# Patient Record
Sex: Female | Born: 1975 | Race: Black or African American | Hispanic: No | Marital: Married | Smoking: Never smoker
Health system: Southern US, Community
[De-identification: ages and names within clinical notes are randomized; demographics above are authoritative.]

## PROBLEM LIST (undated history)

## (undated) DIAGNOSIS — K219 Gastro-esophageal reflux disease without esophagitis: Secondary | ICD-10-CM

## (undated) DIAGNOSIS — Z8489 Family history of other specified conditions: Secondary | ICD-10-CM

## (undated) DIAGNOSIS — Z87898 Personal history of other specified conditions: Secondary | ICD-10-CM

## (undated) DIAGNOSIS — D649 Anemia, unspecified: Secondary | ICD-10-CM

## (undated) HISTORY — PX: ABLATION: SHX5711

---

## 2012-05-07 ENCOUNTER — Emergency Department: Payer: Self-pay | Admitting: Emergency Medicine

## 2012-05-07 LAB — LIPASE, BLOOD: Lipase: 85 U/L (ref 73–393)

## 2012-05-07 LAB — WET PREP, GENITAL

## 2012-05-07 LAB — COMPREHENSIVE METABOLIC PANEL
Alkaline Phosphatase: 78 U/L (ref 50–136)
Calcium, Total: 8.8 mg/dL (ref 8.5–10.1)
Chloride: 106 mmol/L (ref 98–107)
Co2: 24 mmol/L (ref 21–32)
Creatinine: 0.6 mg/dL (ref 0.60–1.30)
EGFR (African American): >60
Osmolality: 269 (ref 275–301)
Potassium: 4.3 mmol/L (ref 3.5–5.1)
SGOT(AST): 263 U/L — ABNORMAL HIGH (ref 15–37)
SGPT (ALT): 119 U/L — ABNORMAL HIGH (ref 12–78)

## 2012-05-07 LAB — URINALYSIS, COMPLETE
Blood: NEGATIVE
Ketone: NEGATIVE
Leukocyte Esterase: NEGATIVE
Nitrite: NEGATIVE
Ph: 7 (ref 4.5–8.0)
RBC,UR: 1 /HPF (ref 0–5)
Squamous Epithelial: 4

## 2012-05-07 LAB — CBC
MCH: 26.6 pg (ref 26.0–34.0)
MCHC: 31.7 g/dL — ABNORMAL LOW (ref 32.0–36.0)
MCV: 84 fL (ref 80–100)
Platelet: 219 10*3/uL (ref 150–440)

## 2012-05-07 LAB — PREGNANCY, URINE: Pregnancy Test, Urine: NEGATIVE m[IU]/mL

## 2015-08-30 ENCOUNTER — Encounter: Payer: Self-pay | Admitting: Sports Medicine

## 2015-08-30 ENCOUNTER — Ambulatory Visit (INDEPENDENT_AMBULATORY_CARE_PROVIDER_SITE_OTHER): Payer: Managed Care, Other (non HMO) | Admitting: Sports Medicine

## 2015-08-30 ENCOUNTER — Ambulatory Visit (INDEPENDENT_AMBULATORY_CARE_PROVIDER_SITE_OTHER): Payer: Managed Care, Other (non HMO)

## 2015-08-30 DIAGNOSIS — M21619 Bunion of unspecified foot: Secondary | ICD-10-CM | POA: Diagnosis not present

## 2015-08-30 DIAGNOSIS — M2141 Flat foot [pes planus] (acquired), right foot: Secondary | ICD-10-CM | POA: Diagnosis not present

## 2015-08-30 DIAGNOSIS — M79673 Pain in unspecified foot: Secondary | ICD-10-CM

## 2015-08-30 DIAGNOSIS — M722 Plantar fascial fibromatosis: Secondary | ICD-10-CM

## 2015-08-30 DIAGNOSIS — M2040 Other hammer toe(s) (acquired), unspecified foot: Secondary | ICD-10-CM

## 2015-08-30 DIAGNOSIS — M2142 Flat foot [pes planus] (acquired), left foot: Secondary | ICD-10-CM
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
## 2015-08-30 NOTE — Patient Instructions (Signed)
Flat Feet Having flat feet is a common condition. One foot or both might be affected. People of any age can have flat feet. In fact, everyone is born with them. But most of the time, the foot gradually develops an arch. That is the curve on the bottom of the foot that creates a gap between the foot and the ground. An arch usually develops in childhood. Sometimes, though, an arch never develops and the foot stays flat on the bottom. Other times, an arch develops but later collapses (caves in). That is what gives the condition its nickname, "fallen arches." The medical term for flat feet is pes planus. Some people have flat feet their whole life and have no problems. For others, the condition causes pain and needs to be corrected.  CAUSES   A problem with the foot's soft tissue; tendons and ligaments could be loose.  This can cause what is called flexible flat feet. That means the shape of the foot changes with pressure. When standing on the toes, a curved arch can be seen. When standing on the ground, the foot is flat.  Wear and tear. Sometimes arches simply flatten over time.  Damage to the posterior tibial tendon. This is the tendon that goes from the inside of the ankle to the bones in the middle of the foot. It is the main support for the arch. If the tendon is injured, stretched or torn, the arch might flatten.  Tarsal coalition. With this condition, two or more bones in the foot are joined together (fused ) during development in the womb. This limits movement and can lead to a flat foot. SYMPTOMS   The foot is even with the ground from toe to heel. Your caregiver will look closely at the inside of the foot while you are standing.  Pain along the bottom of the foot. Some people describe the pain as tightness.  Swelling on the inside of the foot or ankle.  Changes in the way you walk (gait).  The feet lean inward, starting at the ankle (pronation). DIAGNOSIS  To decide if a child or  adult has flat feet, a healthcare provider will probably:  Do a physical examination. This might include having the person stand on his or her toes and then stand normally. The caregiver will also hold the foot and put pressure on the foot in different directions.  Check the person's shoes. The pattern of wear on the soles can offer clues.  Order images (pictures) of the foot. They can help identify the cause of any pain. They also will show injuries to bones or tendons that could be causing the condition. The images can come from:  X-rays.  Computed tomography (CT) scan. This combines X-ray and a computer.  Magnetic resonance imaging (MRI). This uses magnets, radio waves and a computer to take a picture of the foot. It is the best technique to evaluate tendons, ligaments and muscles. TREATMENT   Flexible flat feet usually are painless. Most of the time, gait is not affected. Most children grow out of the condition. Often no treatment is needed. If there is pain, treatment options include:  Orthotics. These are inserts that go in the shoes. They add support and shape to the feet. An orthotic is custom-made from a mold of the foot.  Shoes. Not all shoes are the same. People with flat feet need arch support. However, too much can be painful. It is important to find shoes that offer the right amount   of support. Athletes, especially runners, may need to try shoes made just for people with flatter feet.  Medication. For pain, only take over-the-counter medicine for pain, discomfort, as directed by your caregiver.  Rest. If the feet start to hurt, cut back on the exercise which increases the pain. Use common sense.  For damage to the posterior tibial tendon, options include:  Orthotics. Also adding a wedge on the inside edge may help. This can relieve pressure on the tendon.  Ankle brace, boot or cast. These supports can ease the load on the tendon while it heals.  Surgery. If the tendon is  torn, it might need to be repaired.  For tarsal coalition, similar options apply:  Pain medication.  Orthotics.  A cast and crutches. This keeps weight off the foot.  Physical therapy.  Surgery to remove the bone bridge joining the two bones together. PROGNOSIS  In most people, flat feet do not cause pain or problems. People can go about their normal activities. However, if flat feet are painful, they can and should be treated. Treatment usually relieves the pain. HOME CARE INSTRUCTIONS   Take any medications prescribed by the healthcare provider. Follow the directions carefully.  Wear, or make sure a child wears, orthotics or special shoes if this was suggested. Be sure to ask how often and for how long they should be worn.  Do any exercises or therapy treatments that were suggested.  Take notes on when the pain occurs. This will help healthcare providers decide how to treat the condition.  If surgery is needed, be sure to find out if there is anything that should or should not be done before the operation. SEEK MEDICAL CARE IF:   Pain worsens in the foot or lower leg.  Pain disappears after treatment, but then returns.  Walking or simple exercise becomes difficult or causes foot pain.  Orthotics or special shoes are uncomfortable or painful.   This information is not intended to replace advice given to you by your health care provider. Make sure you discuss any questions you have with your health care provider.   Document Released: 06/03/2009 Document Revised: 10/29/2011 Document Reviewed: 02/02/2015 Elsevier Interactive Patient Education 2016 [employer]  

## 2015-08-30 NOTE — Progress Notes (Deleted)
   Subjective:    Patient ID: Amanda LamerLeslie Lawson, female    DOB: 02/20/1976, 40 y.o.   MRN: [id number]  HPI    Review of Systems  All other systems reviewed and are negative.      Objective:   Physical Exam        Assessment & Plan:

## 2015-10-11 ENCOUNTER — Ambulatory Visit: Payer: Managed Care, Other (non HMO) | Admitting: Sports Medicine

## 2016-03-27 ENCOUNTER — Ambulatory Visit: Payer: Managed Care, Other (non HMO) | Admitting: Sports Medicine

## 2016-12-03 ENCOUNTER — Other Ambulatory Visit: Payer: Self-pay | Admitting: Family Medicine

## 2016-12-03 DIAGNOSIS — Z1239 Encounter for other screening for malignant neoplasm of breast: Secondary | ICD-10-CM

## 2017-07-10 ENCOUNTER — Encounter: Payer: Self-pay | Admitting: Emergency Medicine

## 2017-07-10 ENCOUNTER — Other Ambulatory Visit: Payer: Self-pay

## 2017-07-10 ENCOUNTER — Ambulatory Visit
Admission: EM | Admit: 2017-07-10 | Discharge: 2017-07-10 | Disposition: A | Discharge status: Home / Self Care | Payer: Commercial Managed Care - PPO | Attending: Family Medicine | Admitting: Family Medicine

## 2017-07-10 DIAGNOSIS — N3001 Acute cystitis with hematuria: Secondary | ICD-10-CM

## 2017-07-10 DIAGNOSIS — N390 Urinary tract infection, site not specified: Secondary | ICD-10-CM

## 2017-07-10 DIAGNOSIS — R300 Dysuria: Secondary | ICD-10-CM | POA: Diagnosis not present

## 2017-07-10 LAB — URINALYSIS, COMPLETE (UACMP) WITH MICROSCOPIC
BILIRUBIN URINE: NEGATIVE
GLUCOSE, UA: NEGATIVE mg/dL
KETONES UR: NEGATIVE mg/dL
NITRITE: NEGATIVE
PH: 8 (ref 5.0–8.0)
Protein, ur: 100 mg/dL — AB
Specific Gravity, Urine: 1.02 (ref 1.005–1.030)

## 2017-07-10 MED ORDER — CEPHALEXIN 500 MG PO CAPS: 500.0000 mg | ORAL_CAPSULE | Freq: Two times a day (BID) | ORAL | 0 refills | Status: DC

## 2017-07-10 MED ORDER — PHENAZOPYRIDINE HCL 200 MG PO TABS: 200.0000 mg | ORAL_TABLET | Freq: Three times a day (TID) | ORAL | 0 refills | Status: DC

## 2017-07-10 NOTE — ED Provider Notes (Signed)
[facility]    CSN: [id number] Arrival date & time: 07/10/17  1712     History   Chief Complaint Chief Complaint  Patient presents with  . Dysuria    HPI Amanda Sparks is a 41 y.o. female.   HPI  A 41 year old female complains of burning , frequency, urgency and hematuria that she has had for 1 week.  Dates that she has had frequent urinary tract infections in the past.  She has been ruled out for kidney stones in the past as well.  She does not complain of any flank pain.  She has no nausea vomiting.  She has had no back pain.  Had suprapubic pubic pain evaluated by her primary care provider who did not find "anything wrong".  She states that she is having periods in fact last month she had 2 periods in the month which is unusual and her last period this month ended on the 18th.  It is possible that she is having another  Due to irregularity.         History reviewed. No pertinent past medical history.  There are no active problems to display for this patient.   Past Surgical History:  Procedure Laterality Date  . ABLATION      OB History    No data available       Home Medications    Prior to Admission medications   Medication Sig Start Date End Date Taking? Authorizing Provider  ferrous sulfate 325 (65 FE) MG EC tablet Take 1 tablet by mouth daily. 06/21/15  Yes [provider]  cephALEXin (KEFLEX) 500 MG capsule Take 1 capsule (500 mg total) by mouth 2 (two) times daily. 07/10/17   Lutricia Feiloemer, [place] P, [name]  phenazopyridine (PYRIDIUM) 200 MG tablet Take 1 tablet (200 mg total) by mouth 3 (three) times daily. 07/10/17   Lutricia Feiloemer, Haden Cavenaugh P, [name]    Family History Family History  Problem Relation Age of Onset  . Hypertension Mother   . Diabetes Father     Social History Social History   Tobacco Use  . Smoking status: Never Smoker  . Smokeless tobacco: Never Used  Substance Use Topics  . Alcohol use: No    Alcohol/week: 0.0 oz   Frequency: Never  . Drug use: No     Allergies   Bactrim [sulfamethoxazole-trimethoprim]   Review of Systems Review of Systems  Constitutional: Positive for activity change. Negative for chills, fatigue and fever.  Genitourinary: Positive for dysuria, frequency, hematuria and urgency. Negative for difficulty urinating, dyspareunia, flank pain and vaginal discharge.  All other systems reviewed and are negative.    Physical Exam Triage Vital Signs ED Triage Vitals  Enc Vitals Group     BP 07/10/17 1729 120/70     Pulse Rate 07/10/17 1729 84     Resp 07/10/17 1729 14     Temp 07/10/17 1729 99.1 F (37.3 C)     Temp Source 07/10/17 1729 Oral     SpO2 07/10/17 1729 100 %     Weight 07/10/17 1726 160 lb (72.6 kg)     Height 07/10/17 1726 5\' 4"  (1.626 m)     Head Circumference --      Peak Flow --      Pain Score 07/10/17 1726 9     Pain Loc --      Pain Edu? --      Excl. in GC? --    No data found.  Updated Vital  Signs BP 120/70 (BP Location: Left Arm)   Pulse 84   Temp 99.1 F (37.3 C) (Oral)   Resp 14   Ht 5\' 4"  (1.626 m)   Wt 160 lb (72.6 kg)   LMP 07/07/2017 (Approximate)   SpO2 100%   BMI 27.46 kg/m   Visual Acuity Right Eye Distance:   Left Eye Distance:   Bilateral Distance:    Right Eye Near:   Left Eye Near:    Bilateral Near:     Physical Exam  Constitutional: She is oriented to person, place, and time. She appears well-developed and well-nourished. No distress.  HENT:  Head: Normocephalic.  Eyes: Pupils are equal, round, and reactive to light.  Neck: Normal range of motion.  Pulmonary/Chest: Effort normal and breath sounds normal.  Abdominal: Soft. Bowel sounds are normal. She exhibits no distension and no mass. There is no tenderness. There is no rebound and no guarding.  No CVA tenderness  Musculoskeletal: Normal range of motion.  Neurological: She is alert and oriented to person, place, and time.  Skin: Skin is warm and dry. She is not  diaphoretic.  Psychiatric: She has a normal mood and affect. Her behavior is normal. Judgment and thought content normal.  Nursing note and vitals reviewed.    UC Treatments / Results  Labs (all labs ordered are listed, but only abnormal results are displayed) Labs Reviewed  URINALYSIS, COMPLETE (UACMP) WITH MICROSCOPIC - Abnormal; Notable for the following components:      Result Value   Color, Urine RED (*)    APPearance HAZY (*)    Hgb urine dipstick LARGE (*)    Protein, ur 100 (*)    Leukocytes, UA MODERATE (*)    Squamous Epithelial / LPF 0-5 (*)    Bacteria, UA FEW (*)    All other components within normal limits  URINE CULTURE    EKG  EKG Interpretation None       Radiology No results found.  Procedures Procedures (including critical care time)  Medications Ordered in UC Medications - No data to display   Initial Impression / Assessment and Plan / UC Course  I have reviewed the triage vital signs and the nursing notes.  Pertinent labs & imaging results that were available during my care of the patient were reviewed by me and considered in my medical decision making (see chart for details).     Plan: 1. Test/x-ray results and diagnosis reviewed with patient 2. rx as per orders; risks, benefits, potential side effects reviewed with patient 3. Recommend supportive treatment with increased hydration.  We will treat as if this is a UTI but hematuria may be from irregular periods as she had one last month.  I asked her that if the hematuria does not subside she should go to her primary care physician next week for further evaluation.  Urine cultures will be available in 48 hours.  We will start her on Keflex for 5 days and Pyridium for 2 days.  Begins to run fevers have flank pain nausea or vomiting she should go immediately to the emergency room. 4. F/u prn if symptoms worsen or don't improve   Final Clinical Impressions(s) / UC Diagnoses   Final diagnoses:    Lower urinary tract infectious disease  Dysuria  Hematuria due to acute cystitis    ED Discharge Orders        Ordered    phenazopyridine (PYRIDIUM) 200 MG tablet  3 times daily  07/10/17 1752    cephALEXin (KEFLEX) 500 MG capsule  2 times daily     07/10/17 1752       Controlled Substance Prescriptions [place] Controlled Substance Registry consulted? Not Applicable   Lutricia FeilRoemer, Tannis Burstein P, [name] 07/10/17 [id number]

## 2017-07-10 NOTE — ED Triage Notes (Signed)
Patient c/o burning when she urinates, blood in her urine and increase in urinary frequency for a week.

## 2017-07-13 LAB — URINE CULTURE: Culture: 40000 — AB

## 2017-07-30 ENCOUNTER — Telehealth: Payer: Self-pay | Admitting: *Deleted

## 2017-07-30 NOTE — Telephone Encounter (Signed)
[facility] for [facility] results. Verified DOB, communicated positive for bacteria urine culture result. Advised patient to complete keflex prescribed during visit. Patient reported feeling much better.

## 2017-11-05 ENCOUNTER — Other Ambulatory Visit: Payer: Self-pay | Admitting: Family Medicine

## 2017-11-05 ENCOUNTER — Ambulatory Visit
Admission: RE | Admit: 2017-11-05 | Discharge: 2017-11-05 | Disposition: A | Discharge status: Home / Self Care | Payer: Commercial Managed Care - PPO | Source: Ambulatory Visit | Attending: Family Medicine | Admitting: Family Medicine

## 2017-11-05 ENCOUNTER — Encounter (INDEPENDENT_AMBULATORY_CARE_PROVIDER_SITE_OTHER): Payer: Self-pay

## 2017-11-05 DIAGNOSIS — Z1239 Encounter for other screening for malignant neoplasm of breast: Secondary | ICD-10-CM

## 2017-11-05 DIAGNOSIS — Z1231 Encounter for screening mammogram for malignant neoplasm of breast: Secondary | ICD-10-CM | POA: Insufficient documentation

## 2017-11-25 ENCOUNTER — Ambulatory Visit
Admission: EM | Admit: 2017-11-25 | Discharge: 2017-11-25 | Disposition: A | Discharge status: Home / Self Care | Payer: Commercial Managed Care - PPO | Attending: Family Medicine | Admitting: Family Medicine

## 2017-11-25 ENCOUNTER — Other Ambulatory Visit: Payer: Self-pay

## 2017-11-25 DIAGNOSIS — R319 Hematuria, unspecified: Secondary | ICD-10-CM

## 2017-11-25 DIAGNOSIS — R300 Dysuria: Secondary | ICD-10-CM | POA: Diagnosis not present

## 2017-11-25 DIAGNOSIS — N3001 Acute cystitis with hematuria: Secondary | ICD-10-CM | POA: Diagnosis not present

## 2017-11-25 DIAGNOSIS — R350 Frequency of micturition: Secondary | ICD-10-CM

## 2017-11-25 LAB — URINALYSIS, COMPLETE (UACMP) WITH MICROSCOPIC

## 2017-11-25 MED ORDER — CEPHALEXIN 500 MG PO CAPS: 500.0000 mg | ORAL_CAPSULE | Freq: Two times a day (BID) | ORAL | 0 refills | Status: DC

## 2017-11-25 NOTE — ED Triage Notes (Signed)
Patient complains of urinary urgency, frequency, burning with urination, hematuria, right lower abdominal pain that radiates through right flank.

## 2017-11-25 NOTE — ED Provider Notes (Signed)
[facility]    CSN: [id number] Arrival date & time: 11/25/17  0809     History   Chief Complaint Chief Complaint  Patient presents with  . Urinary Frequency    HPI Amanda Sparks is a 42 y.o. female.   The history is provided by the patient.  Urinary Frequency  This is a new problem. Associated symptoms include abdominal pain (mild).  Dysuria  Pain quality:  Burning Pain severity:  Mild Onset quality:  Sudden Duration:  7 days Timing:  Constant Progression:  Worsening Chronicity:  New Recent urinary tract infections: no   Relieved by:  Phenazopyridine Urinary symptoms: frequent urination and hematuria   Associated symptoms: abdominal pain (mild)   Associated symptoms: no fever, no flank pain, no nausea, no vaginal discharge and no vomiting   Risk factors: no hx of pyelonephritis, no hx of urolithiasis, no kidney transplant, not pregnant, no recurrent urinary tract infections, no renal cysts, no renal disease, no single kidney and no urinary catheter     History reviewed. No pertinent past medical history.  There are no active problems to display for this patient.   Past Surgical History:  Procedure Laterality Date  . ABLATION      OB History   None      Home Medications    Prior to Admission medications   Medication Sig Start Date End Date Taking? Authorizing Provider  ferrous sulfate 325 (65 FE) MG EC tablet Take 1 tablet by mouth daily. 06/21/15  Yes [provider]  cephALEXin (KEFLEX) 500 MG capsule Take 1 capsule (500 mg total) by mouth 2 (two) times daily. 11/25/17   Payton Mccallumonty, Ardenia Stiner, MD  phenazopyridine (PYRIDIUM) 200 MG tablet Take 1 tablet (200 mg total) by mouth 3 (three) times daily. 07/10/17   Lutricia Feiloemer, William P, [name]    Family History Family History  Problem Relation Age of Onset  . Hypertension Mother   . Diabetes Father   . Breast cancer Maternal Grandmother     Social History Social History   Tobacco Use  .  Smoking status: Never Smoker  . Smokeless tobacco: Never Used  Substance Use Topics  . Alcohol use: No    Alcohol/week: 0.0 oz    Frequency: Never  . Drug use: No     Allergies   Bactrim [sulfamethoxazole-trimethoprim]   Review of Systems Review of Systems  Constitutional: Negative for fever.  Gastrointestinal: Positive for abdominal pain (mild). Negative for nausea and vomiting.  Genitourinary: Positive for dysuria and frequency. Negative for flank pain and vaginal discharge.     Physical Exam Triage Vital Signs ED Triage Vitals  Enc Vitals Group     BP 11/25/17 0826 (!) 121/50     Pulse Rate 11/25/17 0826 82     Resp 11/25/17 0826 16     Temp 11/25/17 0826 98.6 F (37 C)     Temp Source 11/25/17 0826 Oral     SpO2 11/25/17 0826 100 %     Weight 11/25/17 0825 159 lb (72.1 kg)     Height 11/25/17 0825 5\' 4"  (1.626 m)     Head Circumference --      Peak Flow --      Pain Score 11/25/17 0825 2     Pain Loc --      Pain Edu? --      Excl. in GC? --    No data found.  Updated Vital Signs BP (!) 121/50 (BP Location: Left Arm)  Pulse 82   Temp 98.6 F (37 C) (Oral)   Resp 16   Ht 5\' 4"  (1.626 m)   Wt 159 lb (72.1 kg)   LMP 11/07/2017   SpO2 100%   BMI 27.29 kg/m   Visual Acuity Right Eye Distance:   Left Eye Distance:   Bilateral Distance:    Right Eye Near:   Left Eye Near:    Bilateral Near:     Physical Exam  Constitutional: She appears well-developed and well-nourished. No distress.  Abdominal: Soft. Bowel sounds are normal. She exhibits no distension and no mass. There is tenderness (mild; suprapubic). There is no rebound and no guarding.  Skin: She is not diaphoretic.  Nursing note and vitals reviewed.    UC Treatments / Results  Labs (all labs ordered are listed, but only abnormal results are displayed) Labs Reviewed  URINALYSIS, COMPLETE (UACMP) WITH MICROSCOPIC - Abnormal; Notable for the following components:      Result Value    Color, Urine ORANGE (*)    APPearance TURBID (*)    Glucose, UA   (*)    Value: TEST NOT REPORTED DUE TO COLOR INTERFERENCE OF URINE PIGMENT   Hgb urine dipstick   (*)    Value: TEST NOT REPORTED DUE TO COLOR INTERFERENCE OF URINE PIGMENT   Bilirubin Urine   (*)    Value: TEST NOT REPORTED DUE TO COLOR INTERFERENCE OF URINE PIGMENT   Ketones, ur   (*)    Value: TEST NOT REPORTED DUE TO COLOR INTERFERENCE OF URINE PIGMENT   Protein, ur   (*)    Value: TEST NOT REPORTED DUE TO COLOR INTERFERENCE OF URINE PIGMENT   Nitrite   (*)    Value: TEST NOT REPORTED DUE TO COLOR INTERFERENCE OF URINE PIGMENT   Leukocytes, UA   (*)    Value: TEST NOT REPORTED DUE TO COLOR INTERFERENCE OF URINE PIGMENT   Squamous Epithelial / LPF 0-5 (*)    Bacteria, UA RARE (*)    All other components within normal limits  URINE CULTURE    EKG None Radiology No results found.  Procedures Procedures (including critical care time)  Medications Ordered in UC Medications - No data to display   Initial Impression / Assessment and Plan / UC Course  I have reviewed the triage vital signs and the nursing notes.  Pertinent labs & imaging results that were available during my care of the patient were reviewed by me and considered in my medical decision making (see chart for details).       Final Clinical Impressions(s) / UC Diagnoses   Final diagnoses:  Acute cystitis with hematuria    ED Discharge Orders        Ordered    cephALEXin (KEFLEX) 500 MG capsule  2 times daily     11/25/17 0908     1. Lab results and diagnosis reviewed with patient 2. rx as per orders above; reviewed possible side effects, interactions, risks and benefits  3. Recommend supportive treatment with increased water intake 4. Follow-up prn if symptoms worsen or don't improve  Controlled Substance Prescriptions [place] Controlled Substance Registry consulted? Not Applicable   Payton Mccallum, MD 11/25/17 1031

## 2017-11-27 ENCOUNTER — Telehealth (HOSPITAL_COMMUNITY): Payer: Self-pay

## 2017-11-27 LAB — URINE CULTURE
Culture: 50000 — AB
SPECIAL REQUESTS: NORMAL

## 2017-11-27 NOTE — Telephone Encounter (Signed)
Pt contacted regarding test results from recent visit. [facility] and was given Keflex at urgent care visit. Pt educated on completing antibiotic and to follow up if symptoms are persistent.

## 2017-12-22 ENCOUNTER — Ambulatory Visit
Admission: EM | Admit: 2017-12-22 | Discharge: 2017-12-22 | Disposition: A | Discharge status: Home / Self Care | Payer: Commercial Managed Care - PPO | Attending: Family Medicine | Admitting: Family Medicine

## 2017-12-22 DIAGNOSIS — B9689 Other specified bacterial agents as the cause of diseases classified elsewhere: Secondary | ICD-10-CM

## 2017-12-22 DIAGNOSIS — M545 Low back pain: Secondary | ICD-10-CM | POA: Diagnosis not present

## 2017-12-22 DIAGNOSIS — N760 Acute vaginitis: Secondary | ICD-10-CM

## 2017-12-22 DIAGNOSIS — R300 Dysuria: Secondary | ICD-10-CM | POA: Diagnosis not present

## 2017-12-22 DIAGNOSIS — B373 Candidiasis of vulva and vagina: Secondary | ICD-10-CM

## 2017-12-22 DIAGNOSIS — B3731 Acute candidiasis of vulva and vagina: Secondary | ICD-10-CM

## 2017-12-22 LAB — WET PREP, GENITAL
Sperm: NONE SEEN
TRICH WET PREP: NONE SEEN

## 2017-12-22 LAB — URINALYSIS, COMPLETE (UACMP) WITH MICROSCOPIC
Glucose, UA: NEGATIVE mg/dL
Nitrite: NEGATIVE
PROTEIN: 30 mg/dL — AB
Specific Gravity, Urine: 1.025 (ref 1.005–1.030)
pH: 6 (ref 5.0–8.0)

## 2017-12-22 MED ORDER — FLUCONAZOLE 150 MG PO TABS: 150.0000 mg | ORAL_TABLET | Freq: Every day | ORAL | 0 refills | Status: DC

## 2017-12-22 MED ORDER — METRONIDAZOLE 500 MG PO TABS: 500.0000 mg | ORAL_TABLET | Freq: Two times a day (BID) | ORAL | 0 refills | Status: DC

## 2017-12-22 MED ORDER — NITROFURANTOIN MONOHYD MACRO 100 MG PO CAPS: 100.0000 mg | ORAL_CAPSULE | Freq: Two times a day (BID) | ORAL | 0 refills | Status: DC

## 2017-12-22 NOTE — Discharge Instructions (Addendum)
Take medication as prescribed. Rest. Drink plenty of fluids.  ° °Follow up with your primary care physician this week. Return to Urgent care for new or worsening concerns.  ° °

## 2017-12-22 NOTE — ED Provider Notes (Signed)
[facility] ____________________________________________  Time seen: Approximately 9:38 AM  I have reviewed the triage vital signs and the nursing notes.   HISTORY  Chief Complaint Vaginitis   HPI Amanda Sparks is a 42 y.o. female presented for evaluation of concern of vaginal symptoms.  Patient reports approximate 1 month ago she was seen and treated for urinary tract infection with oral Keflex.  States as she was completing the antibiotic urinary symptoms resolved, but she started to notice some vaginal itching and occasional vaginal discharge.  States initially it was a white clumpy thick discharge consistent with previous yeast infections.  States that she has tried over-the-counter Monistat type yeast medication 3 times without resolution.  States over the last few days she has had continued intermittent discharge whitish to what yellowish as well as some itching and irritation.  Denies any vaginal pain.  States occasional suprapubic pressure and occasional frequency, denies other urinary symptoms.  Does have some left back pain, but reports pain is only present with movement and fully reproducible.  Denies trauma.  No accompanying fevers.  Abdomen otherwise feels well.  Reports otherwise feels well and denies other complaints.  Sexually active with one partner,  declines concerns of STDs.  Denies pregnancy.  History reviewed. No pertinent past medical history.  There are no active problems to display for this patient.   Past Surgical History:  Procedure Laterality Date  . ABLATION       No current facility-administered medications for this encounter.   Current Outpatient Medications:  .  ferrous sulfate 325 (65 FE) MG EC tablet, Take 1 tablet by mouth daily., Disp: , Rfl: 0 .  fluconazole (DIFLUCAN) 150 MG tablet, Take 1 tablet (150 mg total) by mouth daily. Take one pill orally, then Repeat in one week as needed., Disp: 2 tablet, Rfl: 0 .  metroNIDAZOLE (FLAGYL)  500 MG tablet, Take 1 tablet (500 mg total) by mouth 2 (two) times daily., Disp: 14 tablet, Rfl: 0 .  nitrofurantoin, macrocrystal-monohydrate, (MACROBID) 100 MG capsule, Take 1 capsule (100 mg total) by mouth 2 (two) times daily., Disp: 10 capsule, Rfl: 0 .  phenazopyridine (PYRIDIUM) 200 MG tablet, Take 1 tablet (200 mg total) by mouth 3 (three) times daily., Disp: 6 tablet, Rfl: 0  Allergies Bactrim [sulfamethoxazole-trimethoprim]  Family History  Problem Relation Age of Onset  . Hypertension Mother   . Diabetes Father   . Breast cancer Maternal Grandmother     Social History Social History   Tobacco Use  . Smoking status: Never Smoker  . Smokeless tobacco: Never Used  Substance Use Topics  . Alcohol use: No    Alcohol/week: 0.0 oz    Frequency: Never  . Drug use: No    Review of Systems Constitutional: No fever/chills Cardiovascular: Denies chest pain. Respiratory: Denies shortness of breath. Gastrointestinal: No abdominal pain.  No nausea, no vomiting.  No diarrhea.   Genitourinary: as above Musculoskeletal: as above. Skin: Negative for rash.   ____________________________________________   PHYSICAL EXAM:  VITAL SIGNS: ED Triage Vitals  Enc Vitals Group     BP 12/22/17 0854 (!) 110/41     Pulse Rate 12/22/17 0854 75     Resp 12/22/17 0854 18     Temp 12/22/17 0854 98.2 F (36.8 C)     Temp Source 12/22/17 0854 Oral     SpO2 12/22/17 0854 100 %     Weight --      Height --      Head  Circumference --      Peak Flow --      Pain Score 12/22/17 0857 9     Pain Loc --      Pain Edu? --      Excl. in GC? --     Constitutional: Alert and oriented. Well appearing and in no acute distress. Cardiovascular: Normal rate, regular rhythm. Grossly normal heart sounds.  Good peripheral circulation. Respiratory: Normal respiratory effort without tachypnea nor retractions. Breath sounds are clear and equal bilaterally. No wheezes, rales, rhonchi. Gastrointestinal:  Soft and nontender.  No CVA tenderness. Musculoskeletal:  No midline cervical, thoracic or lumbar tenderness to palpation.  Mild left lower back pain along the lower latissimus dorsi, fully reproducible by direct palpation as well as rotation, no bony tenderness. Neurologic:  Normal speech and language.  Speech is normal. No gait instability.  Skin:  Skin is warm, dry and intact. No rash noted. Psychiatric: Mood and affect are normal. Speech and behavior are normal. Patient exhibits appropriate insight and judgment   ___________________________________________   LABS (all labs ordered are listed, but only abnormal results are displayed)  Labs Reviewed  WET PREP, GENITAL - Abnormal; Notable for the following components:      Result Value   Yeast Wet Prep HPF POC PRESENT (*)    Clue Cells Wet Prep HPF POC PRESENT (*)    WBC, Wet Prep HPF POC FEW (*)    All other components within normal limits  URINALYSIS, COMPLETE (UACMP) WITH MICROSCOPIC - Abnormal; Notable for the following components:   APPearance CLOUDY (*)    Hgb urine dipstick TRACE (*)    Bilirubin Urine SMALL (*)    Ketones, ur TRACE (*)    Protein, ur 30 (*)    Leukocytes, UA SMALL (*)    Bacteria, UA RARE (*)    All other components within normal limits  URINE CULTURE     PROCEDURES Procedures    INITIAL IMPRESSION / ASSESSMENT AND PLAN / ED COURSE  Pertinent labs & imaging results that were available during my care of the patient were reviewed by me and considered in my medical decision making (see chart for details).  Well-appearing patient.  No acute distress.  Will evaluate wet prep as well as urinalysis.  Left back pain suspect musculoskeletal.  Patient denied pelvic exam and elected self wet prep.  Results reviewed.  Positive for bacterial vaginosis and yeast vaginitis.  Urinalysis also reviewed and concern for UTI.  Will treat patient with oral Macrobid, Flagyl and Diflucan.  Discussed probiotics  over-the-counter and supportive care.  Follow-up with primary care in 1 week for urinary resolution.  Encourage rest, fluids, supportive care.Discussed indication, risks and benefits of medications with patient, including no alcohol with medication.  Discussed follow up with Primary care physician this week. Discussed follow up and return parameters including no resolution or any worsening concerns. Patient verbalized understanding and agreed to plan.   ____________________________________________   FINAL CLINICAL IMPRESSION(S) / ED DIAGNOSES  Final diagnoses:  Dysuria  Yeast vaginitis  Bacterial vaginitis     ED Discharge Orders        Ordered    nitrofurantoin, macrocrystal-monohydrate, (MACROBID) 100 MG capsule  2 times daily     12/22/17 1005    metroNIDAZOLE (FLAGYL) 500 MG tablet  2 times daily     12/22/17 1005    fluconazole (DIFLUCAN) 150 MG tablet  Daily     12/22/17 1005       Note:  This dictation was prepared with Dragon dictation along with smaller phrase technology. Any transcriptional errors that result from this process are unintentional.         Renford Dills, NP 12/22/17 1101

## 2017-12-22 NOTE — ED Triage Notes (Signed)
Pt said she thinks the antibiotics caused a yeast infection. She was trying otc products but states she has odor, clear discharge with yellow tint but urinary problems has resolved.

## 2017-12-25 LAB — URINE CULTURE: Culture: >=100000 — AB

## 2017-12-27 ENCOUNTER — Telehealth (HOSPITAL_COMMUNITY): Payer: Self-pay

## 2017-12-27 NOTE — Telephone Encounter (Signed)
Urine culture positive for E.Coli, this was treated with Macrobid at [facility] visit. Attempted to reach patient to go over results, no answer at this time.

## 2018-06-04 ENCOUNTER — Ambulatory Visit: Payer: Self-pay | Admitting: Psychiatry

## 2018-06-04 DIAGNOSIS — F902 Attention-deficit hyperactivity disorder, combined type: Secondary | ICD-10-CM | POA: Insufficient documentation

## 2018-06-04 DIAGNOSIS — F88 Other disorders of psychological development: Secondary | ICD-10-CM | POA: Insufficient documentation

## 2018-06-04 DIAGNOSIS — F331 Major depressive disorder, recurrent, moderate: Secondary | ICD-10-CM | POA: Insufficient documentation

## 2018-06-04 DIAGNOSIS — F4312 Post-traumatic stress disorder, chronic: Secondary | ICD-10-CM | POA: Insufficient documentation

## 2018-06-04 DIAGNOSIS — F429 Obsessive-compulsive disorder, unspecified: Secondary | ICD-10-CM | POA: Insufficient documentation

## 2018-06-05 ENCOUNTER — Other Ambulatory Visit: Payer: Self-pay | Admitting: Psychiatry

## 2018-06-05 NOTE — Progress Notes (Unsigned)
Scheduling entry error for patient of same name and age at [facility] psychiatric removing pre-charting of the incorrect appointment.

## 2018-08-28 ENCOUNTER — Encounter: Payer: Self-pay | Admitting: Emergency Medicine

## 2018-08-28 ENCOUNTER — Emergency Department: Payer: Commercial Managed Care - PPO

## 2018-08-28 ENCOUNTER — Emergency Department
Admission: EM | Admit: 2018-08-28 | Discharge: 2018-08-28 | Disposition: A | Discharge status: Home / Self Care | Payer: Commercial Managed Care - PPO | Attending: Emergency Medicine | Admitting: Emergency Medicine

## 2018-08-28 DIAGNOSIS — R51 Headache: Secondary | ICD-10-CM | POA: Insufficient documentation

## 2018-08-28 DIAGNOSIS — S199XXA Unspecified injury of neck, initial encounter: Secondary | ICD-10-CM | POA: Diagnosis present

## 2018-08-28 DIAGNOSIS — S161XXA Strain of muscle, fascia and tendon at neck level, initial encounter: Secondary | ICD-10-CM | POA: Diagnosis not present

## 2018-08-28 DIAGNOSIS — Y92410 Unspecified street and highway as the place of occurrence of the external cause: Secondary | ICD-10-CM | POA: Diagnosis not present

## 2018-08-28 DIAGNOSIS — Y9389 Activity, other specified: Secondary | ICD-10-CM | POA: Diagnosis not present

## 2018-08-28 DIAGNOSIS — M25511 Pain in right shoulder: Secondary | ICD-10-CM | POA: Insufficient documentation

## 2018-08-28 DIAGNOSIS — Y998 Other external cause status: Secondary | ICD-10-CM | POA: Diagnosis not present

## 2018-08-28 LAB — URINALYSIS, COMPLETE (UACMP) WITH MICROSCOPIC
BILIRUBIN URINE: NEGATIVE
Bacteria, UA: NONE SEEN
Glucose, UA: NEGATIVE mg/dL
Hgb urine dipstick: NEGATIVE
Ketones, ur: NEGATIVE mg/dL
Leukocytes, UA: NEGATIVE
Nitrite: NEGATIVE
Protein, ur: NEGATIVE mg/dL
SPECIFIC GRAVITY, URINE: 1.015 (ref 1.005–1.030)
pH: 7 (ref 5.0–8.0)

## 2018-08-28 LAB — POCT PREGNANCY, URINE: PREG TEST UR: NEGATIVE

## 2018-08-28 MED ORDER — TRAMADOL HCL 50 MG PO TABS: 50.0000 mg | ORAL_TABLET | Freq: Four times a day (QID) | ORAL | 0 refills | Status: DC | PRN

## 2018-08-28 MED ORDER — ACETAMINOPHEN 500 MG PO TABS
1000.0000 mg | ORAL_TABLET | Freq: Once | ORAL | Status: AC
  Administered 2018-08-28: 1000 mg via ORAL
  Filled 2018-08-28: qty 2

## 2018-08-28 MED ORDER — OXYCODONE HCL 5 MG PO TABS
5.0000 mg | ORAL_TABLET | Freq: Once | ORAL | Status: AC
  Administered 2018-08-28: 5 mg via ORAL
  Filled 2018-08-28: qty 1
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
## 2018-09-08 ENCOUNTER — Other Ambulatory Visit: Payer: Self-pay | Admitting: Orthopedic Surgery

## 2018-09-16 ENCOUNTER — Encounter
Admission: RE | Admit: 2018-09-16 | Discharge: 2018-09-16 | Disposition: A | Discharge status: Home / Self Care | Payer: Commercial Managed Care - PPO | Source: Ambulatory Visit | Attending: Orthopedic Surgery | Admitting: Orthopedic Surgery

## 2018-09-16 ENCOUNTER — Other Ambulatory Visit: Payer: Self-pay

## 2018-09-16 ENCOUNTER — Encounter: Payer: Self-pay | Admitting: *Deleted

## 2018-09-16 HISTORY — DX: Anemia, unspecified: D64.9

## 2018-09-16 HISTORY — DX: Personal history of other specified conditions: Z87.898

## 2018-09-16 HISTORY — DX: Family history of other specified conditions: Z84.89

## 2018-09-16 HISTORY — DX: Gastro-esophageal reflux disease without esophagitis: K21.9

## 2018-09-16 NOTE — Patient Instructions (Signed)
Your procedure is scheduled on: 09-23-18 TUESDAY Report to Same Day Surgery 2nd floor medical mall [facility] Entrance-take elevator on left to 2nd floor.  Check in with surgery information desk.) To find out your arrival time please call [phone] between 1PM - 3PM on 09-22-18 MONDAY  Remember: Instructions that are not followed completely may result in serious medical risk, up to and including death, or upon the discretion of your surgeon and anesthesiologist your surgery may need to be rescheduled.    _x___ 1. Do not eat food after midnight the night before your procedure. NO GUM OR CANDY AFTER MIDNIGHT.  You may drink clear liquids up to 2 hours before you are scheduled to arrive at the hospital for your procedure.  Do not drink clear liquids within 2 hours of your scheduled arrival to the hospital.  Clear liquids include  --Water or Apple juice without pulp  --Clear carbohydrate beverage such as ClearFast or Gatorade  --Black Coffee or Clear Tea (No milk, no creamers, do not add anything to the coffee or Tea   ____Ensure clear carbohydrate drink on the way to the hospital for bariatric patients  ____Ensure clear carbohydrate drink 3 hours before surgery for Dr Rutherford Nail patients if physician instructed.    __x__ 2. No Alcohol for 24 hours before or after surgery.   __x__3. No Smoking or e-cigarettes for 24 prior to surgery.  Do not use any chewable tobacco products for at least 6 hour prior to surgery   ____  4. Bring all medications with you on the day of surgery if instructed.    __x__ 5. Notify your doctor if there is any change in your medical condition     (cold, fever, infections).    x___6. On the morning of surgery brush your teeth with toothpaste and water.  You may rinse your mouth with mouth wash if you wish.  Do not swallow any toothpaste or mouthwash.   Do not wear jewelry, make-up, hairpins, clips or nail polish.  Do not wear lotions, powders, or perfumes. You  may wear deodorant.  Do not shave 48 hours prior to surgery. Men may shave face and neck.  Do not bring valuables to the hospital.    [facility] is not responsible for any belongings or valuables.               Contacts, dentures or bridgework may not be worn into surgery.  Leave your suitcase in the car. After surgery it may be brought to your room.  For patients admitted to the hospital, discharge time is determined by your treatment team.  _  Patients discharged the day of surgery will not be allowed to drive home.  You will need someone to drive you home and stay with you the night of your procedure.    Please read over the following fact sheets that you were given:   [facility] Preparing for Surgery  ____ Take anti-hypertensive listed below, cardiac, seizure, asthma, anti-reflux and psychiatric medicines. These include:  1. NONE  2.  3.  4.  5.  6.  ____Fleets enema or Magnesium Citrate as directed.   _x___ Use CHG Soap or sage wipes as directed on instruction sheet   ____ Use inhalers on the day of surgery and bring to hospital day of surgery  ____ Stop Metformin and Janumet 2 days prior to surgery.    ____ Take 1/2 of usual insulin dose the night before surgery and none on the  morning surgery.   ____ Follow recommendations from Cardiologist, Pulmonologist or PCP regarding stopping Aspirin, Coumadin, Plavix ,Eliquis, Effient, or Pradaxa, and Pletal.  X____Stop Anti-inflammatories such as Advil, Aleve, Ibuprofen, Motrin, Naproxen, Naprosyn, Goodies powders or aspirin products NOW-OK to take Tylenol    ____ Stop supplements until after surgery.   ____ Bring C-Pap to the hospital.
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
## 2018-09-17 ENCOUNTER — Inpatient Hospital Stay: Admission: RE | Admit: 2018-09-17 | Payer: Commercial Managed Care - PPO | Source: Ambulatory Visit

## 2018-09-18 ENCOUNTER — Encounter
Admission: RE | Admit: 2018-09-18 | Discharge: 2018-09-18 | Disposition: A | Discharge status: Home / Self Care | Payer: Commercial Managed Care - PPO | Source: Ambulatory Visit | Attending: Orthopedic Surgery | Admitting: Orthopedic Surgery

## 2018-09-18 DIAGNOSIS — Z01812 Encounter for preprocedural laboratory examination: Secondary | ICD-10-CM | POA: Insufficient documentation

## 2018-09-18 LAB — CBC WITH DIFFERENTIAL/PLATELET
ABS IMMATURE GRANULOCYTES: 0.01 10*3/uL (ref 0.00–0.07)
Basophils Absolute: 0 10*3/uL (ref 0.0–0.1)
Basophils Relative: 1 %
Eosinophils Absolute: 0 10*3/uL (ref 0.0–0.5)
Eosinophils Relative: 1 %
HCT: 39.9 % (ref 36.0–46.0)
Hemoglobin: 12.5 g/dL (ref 12.0–15.0)
Immature Granulocytes: 0 %
Lymphocytes Relative: 37 %
Lymphs Abs: 1.3 10*3/uL (ref 0.7–4.0)
MCH: 26.5 pg (ref 26.0–34.0)
MCHC: 31.3 g/dL (ref 30.0–36.0)
MCV: 84.7 fL (ref 80.0–100.0)
Monocytes Absolute: 0.3 10*3/uL (ref 0.1–1.0)
Monocytes Relative: 8 %
Neutro Abs: 1.8 10*3/uL (ref 1.7–7.7)
Neutrophils Relative %: 53 %
Platelets: 333 10*3/uL (ref 150–400)
RBC: 4.71 MIL/uL (ref 3.87–5.11)
RDW: 12.9 % (ref 11.5–15.5)
WBC: 3.4 10*3/uL — ABNORMAL LOW (ref 4.0–10.5)
nRBC: 0 % (ref 0.0–0.2)

## 2018-09-18 LAB — APTT: APTT: 35 s (ref 24–36)

## 2018-09-18 LAB — BASIC METABOLIC PANEL
Anion gap: 5 (ref 5–15)
BUN: 9 mg/dL (ref 6–20)
CO2: 26 mmol/L (ref 22–32)
Calcium: 8.9 mg/dL (ref 8.9–10.3)
Chloride: 107 mmol/L (ref 98–111)
Creatinine, Ser: 0.6 mg/dL (ref 0.44–1.00)
GFR calc Af Amer: >60 mL/min (ref >60)
GFR calc non Af Amer: >60 mL/min (ref >60)
Glucose, Bld: 80 mg/dL (ref 70–99)
POTASSIUM: 3.4 mmol/L — AB (ref 3.5–5.1)
Sodium: 138 mmol/L (ref 135–145)

## 2018-09-18 LAB — PROTIME-INR
INR: 0.94
Prothrombin Time: 12.5 seconds (ref 11.4–15.2)

## 2018-09-29 MED ORDER — CEFAZOLIN SODIUM-DEXTROSE 2-4 GM/100ML-% IV SOLN
2.0000 g | INTRAVENOUS | Status: AC
  Administered 2018-09-30: 2 g via INTRAVENOUS

## 2018-09-30 ENCOUNTER — Ambulatory Visit
Admission: RE | Admit: 2018-09-30 | Discharge: 2018-09-30 | Disposition: A | Discharge status: Home / Self Care | Payer: Commercial Managed Care - PPO | Source: Ambulatory Visit | Attending: Orthopedic Surgery | Admitting: Orthopedic Surgery

## 2018-09-30 ENCOUNTER — Ambulatory Visit: Payer: Commercial Managed Care - PPO | Admitting: Certified Registered Nurse Anesthetist

## 2018-09-30 ENCOUNTER — Other Ambulatory Visit: Payer: Self-pay

## 2018-09-30 ENCOUNTER — Encounter
Admission: RE | Disposition: A | Discharge status: Home / Self Care | Payer: Self-pay | Source: Ambulatory Visit | Attending: Orthopedic Surgery

## 2018-09-30 ENCOUNTER — Encounter: Payer: Self-pay | Admitting: *Deleted

## 2018-09-30 DIAGNOSIS — G5601 Carpal tunnel syndrome, right upper limb: Secondary | ICD-10-CM | POA: Insufficient documentation

## 2018-09-30 DIAGNOSIS — Z881 Allergy status to other antibiotic agents status: Secondary | ICD-10-CM | POA: Diagnosis not present

## 2018-09-30 DIAGNOSIS — Z79899 Other long term (current) drug therapy: Secondary | ICD-10-CM | POA: Insufficient documentation

## 2018-09-30 HISTORY — PX: CARPAL TUNNEL RELEASE: SHX101

## 2018-09-30 LAB — POCT PREGNANCY, URINE: Preg Test, Ur: NEGATIVE

## 2018-09-30 SURGERY — CARPAL TUNNEL RELEASE
Anesthesia: General | Site: Hand | Laterality: Right

## 2018-09-30 MED ORDER — CHLORHEXIDINE GLUCONATE CLOTH 2 % EX PADS: 6.0000 | MEDICATED_PAD | Freq: Once | CUTANEOUS | Status: DC

## 2018-09-30 MED ORDER — PROPOFOL 10 MG/ML IV BOLUS
INTRAVENOUS | Status: DC | PRN
  Administered 2018-09-30: 160 mg via INTRAVENOUS
  Administered 2018-09-30: 40 mg via INTRAVENOUS

## 2018-09-30 MED ORDER — GENTAMICIN SULFATE 40 MG/ML IJ SOLN
INTRAMUSCULAR | Status: AC
  Filled 2018-09-30: qty 2

## 2018-09-30 MED ORDER — MIDAZOLAM HCL 2 MG/2ML IJ SOLN
INTRAMUSCULAR | Status: AC
  Filled 2018-09-30: qty 2

## 2018-09-30 MED ORDER — LIDOCAINE HCL (PF) 2 % IJ SOLN
INTRAMUSCULAR | Status: AC
  Filled 2018-09-30: qty 10

## 2018-09-30 MED ORDER — BUPIVACAINE HCL (PF) 0.25 % IJ SOLN
INTRAMUSCULAR | Status: AC
  Filled 2018-09-30: qty 30

## 2018-09-30 MED ORDER — FENTANYL CITRATE (PF) 100 MCG/2ML IJ SOLN
INTRAMUSCULAR | Status: AC
  Filled 2018-09-30: qty 2

## 2018-09-30 MED ORDER — PROPOFOL 10 MG/ML IV BOLUS
INTRAVENOUS | Status: AC
  Filled 2018-09-30: qty 20

## 2018-09-30 MED ORDER — LACTATED RINGERS IV SOLN
INTRAVENOUS | Status: DC
  Administered 2018-09-30: 07:00:00 via INTRAVENOUS

## 2018-09-30 MED ORDER — GLYCOPYRROLATE 0.2 MG/ML IJ SOLN
INTRAMUSCULAR | Status: AC
  Filled 2018-09-30: qty 1

## 2018-09-30 MED ORDER — SODIUM CHLORIDE 0.9 % IV SOLN
INTRAVENOUS | Status: DC | PRN
  Administered 2018-09-30: 08:00:00

## 2018-09-30 MED ORDER — FENTANYL CITRATE (PF) 100 MCG/2ML IJ SOLN
25.0000 ug | INTRAMUSCULAR | Status: DC | PRN
  Administered 2018-09-30 (×4): 25 ug via INTRAVENOUS

## 2018-09-30 MED ORDER — DEXAMETHASONE SODIUM PHOSPHATE 10 MG/ML IJ SOLN
INTRAMUSCULAR | Status: AC
  Filled 2018-09-30: qty 1

## 2018-09-30 MED ORDER — MIDAZOLAM HCL 2 MG/2ML IJ SOLN
INTRAMUSCULAR | Status: DC | PRN
  Administered 2018-09-30: 2 mg via INTRAVENOUS

## 2018-09-30 MED ORDER — OXYCODONE HCL 5 MG PO TABS: 5.0000 mg | ORAL_TABLET | ORAL | 0 refills | Status: DC | PRN

## 2018-09-30 MED ORDER — FAMOTIDINE 20 MG PO TABS
20.0000 mg | ORAL_TABLET | Freq: Once | ORAL | Status: AC
  Administered 2018-09-30: 20 mg via ORAL

## 2018-09-30 MED ORDER — FENTANYL CITRATE (PF) 100 MCG/2ML IJ SOLN
INTRAMUSCULAR | Status: DC | PRN
  Administered 2018-09-30: 25 ug via INTRAVENOUS
  Administered 2018-09-30: 50 ug via INTRAVENOUS
  Administered 2018-09-30: 25 ug via INTRAVENOUS

## 2018-09-30 MED ORDER — ONDANSETRON HCL 4 MG/2ML IJ SOLN
4.0000 mg | Freq: Once | INTRAMUSCULAR | Status: AC | PRN
  Administered 2018-09-30: 4 mg via INTRAVENOUS

## 2018-09-30 MED ORDER — ONDANSETRON HCL 4 MG/2ML IJ SOLN
INTRAMUSCULAR | Status: DC | PRN
  Administered 2018-09-30: 4 mg via INTRAVENOUS

## 2018-09-30 MED ORDER — SUCCINYLCHOLINE CHLORIDE 20 MG/ML IJ SOLN
INTRAMUSCULAR | Status: AC
  Filled 2018-09-30: qty 1

## 2018-09-30 MED ORDER — FENTANYL CITRATE (PF) 100 MCG/2ML IJ SOLN
INTRAMUSCULAR | Status: AC
  Administered 2018-09-30: 25 ug via INTRAVENOUS
  Filled 2018-09-30: qty 2

## 2018-09-30 MED ORDER — LIDOCAINE HCL (CARDIAC) PF 100 MG/5ML IV SOSY
PREFILLED_SYRINGE | INTRAVENOUS | Status: DC | PRN
  Administered 2018-09-30: 60 mg via INTRAVENOUS

## 2018-09-30 MED ORDER — PHENYLEPHRINE HCL 10 MG/ML IJ SOLN
INTRAMUSCULAR | Status: AC
  Filled 2018-09-30: qty 1

## 2018-09-30 MED ORDER — ONDANSETRON HCL 4 MG/2ML IJ SOLN
INTRAMUSCULAR | Status: AC
  Filled 2018-09-30: qty 2

## 2018-09-30 MED ORDER — EPHEDRINE SULFATE 50 MG/ML IJ SOLN
INTRAMUSCULAR | Status: AC
  Filled 2018-09-30: qty 1

## 2018-09-30 MED ORDER — DEXAMETHASONE SODIUM PHOSPHATE 10 MG/ML IJ SOLN
INTRAMUSCULAR | Status: DC | PRN
  Administered 2018-09-30: 10 mg via INTRAVENOUS

## 2018-09-30 MED ORDER — ONDANSETRON HCL 4 MG PO TABS: 4.0000 mg | ORAL_TABLET | Freq: Three times a day (TID) | ORAL | 0 refills | Status: DC | PRN

## 2018-09-30 SURGICAL SUPPLY — 46 items
BANDAGE ELASTIC 4 LF NS (GAUZE/BANDAGES/DRESSINGS) ×6 IMPLANT
BLADE SURG MINI STRL (BLADE) ×3 IMPLANT
BNDG ESMARK 4X12 TAN STRL LF (GAUZE/BANDAGES/DRESSINGS) ×3 IMPLANT
CANISTER SUCT 1200ML W/VALVE (MISCELLANEOUS) ×3 IMPLANT
CLOSURE WOUND 1/2 X4 (GAUZE/BANDAGES/DRESSINGS) ×1
CORD BIP STRL DISP 12FT (MISCELLANEOUS) ×3 IMPLANT
COVER WAND RF STERILE (DRAPES) IMPLANT
CUFF TOURN 18 STER (MISCELLANEOUS) ×3 IMPLANT
DRAPE IMP U-DRAPE 54X76 (DRAPES) ×3 IMPLANT
DRAPE SURG 17X11 SM STRL (DRAPES) ×3 IMPLANT
DRSG GAUZE FLUFF 36X18 (GAUZE/BANDAGES/DRESSINGS) ×3 IMPLANT
DURAPREP 26ML APPLICATOR (WOUND CARE) ×6 IMPLANT
ELECT REM PT RETURN 9FT ADLT (ELECTROSURGICAL) ×3
ELECTRODE REM PT RTRN 9FT ADLT (ELECTROSURGICAL) ×1 IMPLANT
FORCEPS JEWEL BIP 4-3/4 STR (INSTRUMENTS) ×3 IMPLANT
GAUZE PETRO XEROFOAM 1X8 (MISCELLANEOUS) ×3 IMPLANT
GAUZE SPONGE 4X4 12PLY STRL (GAUZE/BANDAGES/DRESSINGS) ×3 IMPLANT
GLOVE BIOGEL PI IND STRL 7.5 (GLOVE) ×6 IMPLANT
GLOVE BIOGEL PI IND STRL 9 (GLOVE) ×1 IMPLANT
GLOVE BIOGEL PI INDICATOR 7.5 (GLOVE) ×12
GLOVE BIOGEL PI INDICATOR 9 (GLOVE) ×2
GLOVE SURG 9.0 ORTHO LTXF (GLOVE) ×6 IMPLANT
GOWN STRL REUS TWL 2XL XL LVL4 (GOWN DISPOSABLE) ×3 IMPLANT
GOWN STRL REUS W/ TWL LRG LVL3 (GOWN DISPOSABLE) ×3 IMPLANT
GOWN STRL REUS W/TWL LRG LVL3 (GOWN DISPOSABLE) ×6
KIT TURNOVER KIT A (KITS) ×3 IMPLANT
NEEDLE FILTER BLUNT 18X 1/2SAF (NEEDLE) ×2
NEEDLE FILTER BLUNT 18X1 1/2 (NEEDLE) ×1 IMPLANT
NS IRRIG 500ML POUR BTL (IV SOLUTION) ×3 IMPLANT
PACK EXTREMITY ARMC (MISCELLANEOUS) ×3 IMPLANT
PAD CAST CTTN 4X4 STRL (SOFTGOODS) ×2 IMPLANT
PADDING CAST 4IN STRL (MISCELLANEOUS) ×2
PADDING CAST BLEND 4X4 STRL (MISCELLANEOUS) ×1 IMPLANT
PADDING CAST COTTON 4X4 STRL (SOFTGOODS) ×4
SLING ARM LRG DEEP (SOFTGOODS) IMPLANT
SLING ARM M TX990204 (SOFTGOODS) ×3 IMPLANT
SPLINT CAST 1 STEP 3X12 (MISCELLANEOUS) ×3 IMPLANT
STOCKINETTE STRL 4IN 9604848 (GAUZE/BANDAGES/DRESSINGS) ×3 IMPLANT
STRIP CLOSURE SKIN 1/2X4 (GAUZE/BANDAGES/DRESSINGS) ×2 IMPLANT
SUT ETHILON 3-0 FS-10 30 BLK (SUTURE) ×3
SUT ETHILON 4-0 (SUTURE)
SUT ETHILON 4-0 FS2 18XMFL BLK (SUTURE)
SUT ETHILON 5-0 FS-2 18 BLK (SUTURE) ×3 IMPLANT
SUTURE EHLN 3-0 FS-10 30 BLK (SUTURE) ×1 IMPLANT
SUTURE ETHLN 4-0 FS2 18XMF BLK (SUTURE) IMPLANT
SYR 3ML LL SCALE MARK (SYRINGE) ×3 IMPLANT

## 2018-09-30 NOTE — Anesthesia Procedure Notes (Signed)
Procedure Name: LMA Insertion Date/Time: 09/30/2018 7:59 AM Performed by: Riccardo Dubin, CRNA Pre-anesthesia Checklist: Patient identified, Patient being monitored, Timeout performed, Emergency Drugs available and Suction available Patient Re-evaluated:Patient Re-evaluated prior to induction Oxygen Delivery Method: Circle system utilized Preoxygenation: Pre-oxygenation with 100% oxygen Induction Type: IV induction Ventilation: Mask ventilation without difficulty LMA: LMA inserted LMA Size: 3.5 Tube type: Oral Number of attempts: 1 Placement Confirmation: positive ETCO2 and breath sounds checked- equal and bilateral Tube secured with: Tape Dental Injury: Teeth and Oropharynx as per pre-operative assessment

## 2018-09-30 NOTE — Transfer of Care (Signed)
Immediate Anesthesia Transfer of Care Note  Patient: Amanda Sparks  Procedure(s) Performed: CARPAL TUNNEL RELEASE (Right Hand)  Patient Location: PACU  Anesthesia Type:General  Level of Consciousness: drowsy  Airway & Oxygen Therapy: Patient Spontanous Breathing and Patient connected to face mask oxygen  Post-op Assessment: Report given to RN and Post -op Vital signs reviewed and stable  Post vital signs: Reviewed and stable  Last Vitals:  Vitals Value Taken Time  BP 138/50 09/30/2018  9:06 AM  Temp 36.6 C 09/30/2018  9:06 AM  Pulse 71 09/30/2018  9:11 AM  Resp 11 09/30/2018  9:11 AM  SpO2 100 % 09/30/2018  9:11 AM  Vitals shown include unvalidated device data.  Last Pain:  Vitals:   09/30/18 0906  PainSc: Asleep         Complications: No apparent anesthesia complications

## 2018-09-30 NOTE — H&P (Signed)
PREOPERATIVE H&P  Chief Complaint: CARPAL TUNNEL SYNDROME OF RIGHT WRIST  HPI: Amanda Sparks is a 43 y.o. female who presents for preoperative history and physical with a diagnosis of CARPAL TUNNEL SYNDROME OF RIGHT WRIST confirmed by EMG/NCV. Symptoms are rated as moderate to severe, and have been worsening.  This is significantly impairing activities of daily living.  She has elected for surgical management.   Past Medical History:  Diagnosis Date  . Anemia   . Family history of adverse reaction to anesthesia    1st cousin-never woke back up after surgery  . GERD (gastroesophageal reflux disease)    h/o  . History of palpitations    Past Surgical History:  Procedure Laterality Date  . ABLATION     Social History   Socioeconomic History  . Marital status: Married    Spouse name: Not on file  . Number of children: Not on file  . Years of education: Not on file  . Highest education level: Not on file  Occupational History  . Not on file  Social Needs  . Financial resource strain: Not on file  . Food insecurity:    Worry: Not on file    Inability: Not on file  . Transportation needs:    Medical: Not on file    Non-medical: Not on file  Tobacco Use  . Smoking status: Never Smoker  . Smokeless tobacco: Never Used  Substance and Sexual Activity  . Alcohol use: No    Alcohol/week: 0.0 standard drinks    Frequency: Never  . Drug use: No  . Sexual activity: Yes  Lifestyle  . Physical activity:    Days per week: Not on file    Minutes per session: Not on file  . Stress: Not on file  Relationships  . Social connections:    Talks on phone: Not on file    Gets together: Not on file    Attends religious service: Not on file    Active member of club or organization: Not on file    Attends meetings of clubs or organizations: Not on file    Relationship status: Not on file  Other Topics Concern  . Not on file  Social History Narrative  . Not on file   Family  History  Problem Relation Age of Onset  . Hypertension Mother   . Diabetes Father   . Breast cancer Maternal Grandmother    Allergies  Allergen Reactions  . Bactrim [Sulfamethoxazole-Trimethoprim] Rash   Prior to Admission medications   Medication Sig Start Date End Date Taking? Authorizing Provider  acetaminophen (TYLENOL) 325 MG tablet Take 650 mg by mouth every 6 (six) hours as needed.   Yes [provider]  Cholecalciferol (VITAMIN D3) 1.25 MG (50000 UT) CAPS Take 1 tablet by mouth once a week. X 4 weeks   Yes [provider]  Cholecalciferol (VITAMIN D3) 75 MCG (3000 UT) TABS Take 3,000 Units by mouth daily.   Yes [provider]  traMADol (ULTRAM) 50 MG tablet Take 1 tablet (50 mg total) by mouth every 6 (six) hours as needed. Patient not taking: Reported on 09/16/2018 08/28/18 08/28/19 Yes Don PerkingVeronese, [place], MD     Positive ROS: All other systems have been reviewed and were otherwise negative with the exception of those mentioned in the HPI and as above.  Physical Exam: General: Alert, no acute distress Cardiovascular: Regular rate and rhythm, no murmurs rubs or gallops.  No pedal edema Respiratory: Clear to auscultation  bilaterally, no wheezes rales or rhonchi. No cyanosis, no use of accessory musculature GI: No organomegaly, abdomen is soft and non-tender nondistended with positive bowel sounds. Skin: Skin intact, no lesions within the operative field. Neurologic: Sensation intact distally Psychiatric: Patient is competent for consent with normal mood and affect Lymphatic: No cervical lymphadenopathy  MUSCULOSKELETAL: Right wrist:  + paresthesias/decreased sensation in thumb, index and middle fingers.  No thenar or hypothenar atrophy.  Full digital ROM.  Fingers well perfused.  Assessment: CARPAL TUNNEL SYNDROME OF RIGHT WRIST  Plan: Plan for Procedure(s): CARPAL TUNNEL RELEASE, RIGHT WRIST  I have discussed the details of the operation and the  post-op course with the patient.  I discussed the risks and benefits of surgery. The risks include but are not limited to infection, bleeding, nerve or blood vessel injury, joint stiffness or loss of motion, persistent pain or numbness, recurrent carpal tunnel syndrome and the need for further surgery. Medical risks include but are not limited to DVT and pulmonary embolism, myocardial infarction, stroke, pneumonia, respiratory failure and death. Patient understood these risks and wished to proceed.     Juanell Fairly, MD   09/30/2018 7:50 AM

## 2018-09-30 NOTE — Op Note (Signed)
  09/30/2018  9:10 AM  PATIENT:  Amanda Sparks    PRE-OPERATIVE DIAGNOSIS:  RIGHT CARPAL TUNNEL SYNDROME   POST-OPERATIVE DIAGNOSIS:  Same  PROCEDURE:  RIGHT OPEN CARPAL TUNNEL RELEASE  SURGEON:  Thornton Park, MD  ANESTHESIA:   General  TOURNIQUET TIME:  32 minutes  PREOPERATIVE INDICATIONS:  Amanda Sparks is a  43 y.o. female with a diagnosis of RIGHT CARPAL TUNNEL SYNDROME who failed conservative measures and elected for surgical management.    I discussed the risks and benefits of surgery. The risks include but are not limited to infection, bleeding, nerve or blood vessel injury, joint stiffness or loss of motion, persistent pain, weakness, recurrence of symptoms and the need for further surgery. Medical risks include but are not limited to DVT and pulmonary embolism, myocardial infarction, stroke, pneumonia, respiratory failure and death. Patient understood these risks and wished to proceed.   OPERATIVE FINDINGS: Significant median nerve compression at the carpal tunnel, right upper extremity  OPERATIVE PROCEDURE: Patient was met in the preoperative area. I signed the right wrist with my initials and the word yes according the hospital's correct site of surgery protocol. The H&P was updated. I answered all the patient's questions. She was then brought to the operating room where she underwent general with LMA.  She was positioned supine on the operative table. The right arm was placed on a hand table. A tourniquet was applied to the right upper extremity. The patient was prepped and draped in a sterile fashion. A timeout was performed to verify the patient's name, date of birth, medical record number, correct site of surgery correct procedure to be performed. The time out was also used to confirm the patient received antibiotics that all necessary instruments were available in the room. The right upper extremity was then exsanguinated with an Esmarch and the tourniquet  inflated to 250 mmHg.   An incision following the palmar crease was made. This was made in line with the web space between the middle and ring fingers and the distal extent of the incision was where it intersected Kaplan's cardinal line. Bleeding vessels were cauterized with a bipolar.  The subcutaneous tissue was carefully dissected out with a Metzenbaum scissor and pickup until the palmar fascia was encountered. The distal extent of the transverse carpal ligament was then identified. A Freer elevator was placed under the transverse carpal ligament running distally to proximally. A micro-Beaver blade was then used to incise the transverse carpal ligament taking care to avoid injury to any neurovascular structures. The carpal tunnel was found to be extremely constricted. There was significant compression on the median nerve. The transverse carpal ligament was completely released. The nerve was visualized in its entirety and the carpal tunnel.   The wound was copiously irrigated. The skin was then approximated with 5-0 nylon. Xeroform was placed over the incision. A dry sterile dressing was applied along with a volar fiberglass splint. Patient was overwrapped with an Ace wrap. The tourniquet was deflated at 32 minutes.  Sling was placed on the right upper extremity. The patient was extubated and brought to the PACU in stable condition. I was scrubbed and present the entire case and all sharp and instrument counts were correct at the conclusion the case.     Timoteo Gaul, MD

## 2018-09-30 NOTE — Anesthesia Preprocedure Evaluation (Signed)
Anesthesia Evaluation  Patient identified by MRN, date of birth, ID band Patient awake    Reviewed: Allergy & Precautions, H&P , NPO status , Patient's Chart, lab work & pertinent test results, reviewed documented beta blocker date and time   Airway Mallampati: II  TM Distance: >3 FB Neck ROM: full    Dental  (+) Teeth Intact   Pulmonary neg pulmonary ROS,    Pulmonary exam normal        Cardiovascular Exercise Tolerance: Good negative cardio ROS Normal cardiovascular exam Rate:Normal     Neuro/Psych negative neurological ROS  negative psych ROS   GI/Hepatic Neg liver ROS, GERD  Medicated,  Endo/Other  negative endocrine ROS  Renal/GU negative Renal ROS  negative genitourinary   Musculoskeletal   Abdominal   Peds  Hematology  (+) Blood dyscrasia, anemia ,   Anesthesia Other Findings   Reproductive/Obstetrics negative OB ROS                             Anesthesia Physical Anesthesia Plan  ASA: II  Anesthesia Plan: General LMA   Post-op Pain Management:    Induction:   PONV Risk Score and Plan:   Airway Management Planned:   Additional Equipment:   Intra-op Plan:   Post-operative Plan:   Informed Consent: I have reviewed the patients History and Physical, chart, labs and discussed the procedure including the risks, benefits and alternatives for the proposed anesthesia with the patient or authorized representative who has indicated his/her understanding and acceptance.       Plan Discussed with: CRNA  Anesthesia Plan Comments:         Anesthesia Quick Evaluation

## 2018-09-30 NOTE — Discharge Instructions (Signed)

## 2018-09-30 NOTE — Progress Notes (Signed)
Ch visited w/ pt and family member pre-op. Pt had a positive affect while ch conversed w/ her. Pt shared that shewas supposed to hv surgery at an earlier time but got a virus. Ch encouraged pt and ensured that the procedure would go well. Pt was thankful of visit.     09/30/18 0700  Clinical Encounter Type  Visited With Patient and family together  Visit Type Psychological support;Spiritual support;Social support;Pre-op  Spiritual Encounters  Spiritual Needs Emotional;Grief support  Stress Factors  Patient Stress Factors None identified  Family Stress Factors None identified

## 2018-09-30 NOTE — Anesthesia Post-op Follow-up Note (Signed)
Anesthesia QCDR form completed.        

## 2018-10-02 NOTE — Anesthesia Postprocedure Evaluation (Signed)
Anesthesia Post Note  Patient: Amanda Sparks  Procedure(s) Performed: CARPAL TUNNEL RELEASE (Right Hand)  Patient location during evaluation: PACU Anesthesia Type: General Level of consciousness: awake and alert Pain management: pain level controlled Vital Signs Assessment: post-procedure vital signs reviewed and stable Respiratory status: spontaneous breathing, nonlabored ventilation, respiratory function stable and patient connected to nasal cannula oxygen Cardiovascular status: blood pressure returned to baseline and stable Postop Assessment: no apparent nausea or vomiting Anesthetic complications: no     Last Vitals:  Vitals:   09/30/18 0940 09/30/18 0957  BP:  134/67  Pulse: 64 67  Resp: 14 16  Temp:  36.5 C  SpO2: 98% 100%    Last Pain:  Vitals:   09/30/18 0957  TempSrc: Temporal  PainSc: 2                  Yevette Edwards

## 2019-02-12 ENCOUNTER — Other Ambulatory Visit: Payer: Self-pay | Admitting: Family Medicine

## 2019-02-12 DIAGNOSIS — Z1231 Encounter for screening mammogram for malignant neoplasm of breast: Secondary | ICD-10-CM

## 2019-04-01 ENCOUNTER — Other Ambulatory Visit: Payer: Self-pay

## 2019-04-01 DIAGNOSIS — Z20822 Contact with and (suspected) exposure to covid-19: Secondary | ICD-10-CM

## 2019-04-02 LAB — NOVEL CORONAVIRUS, NAA: SARS-CoV-2, NAA: NOT DETECTED

## 2019-06-12 ENCOUNTER — Other Ambulatory Visit: Payer: Self-pay

## 2019-06-12 DIAGNOSIS — Z20822 Contact with and (suspected) exposure to covid-19: Secondary | ICD-10-CM

## 2019-06-14 LAB — NOVEL CORONAVIRUS, NAA: SARS-CoV-2, NAA: NOT DETECTED

## 2019-07-31 ENCOUNTER — Other Ambulatory Visit: Payer: Self-pay

## 2019-07-31 DIAGNOSIS — Z20822 Contact with and (suspected) exposure to covid-19: Secondary | ICD-10-CM

## 2019-08-02 LAB — NOVEL CORONAVIRUS, NAA: SARS-CoV-2, NAA: NOT DETECTED

## 2019-08-31 ENCOUNTER — Ambulatory Visit
Admission: RE | Admit: 2019-08-31 | Discharge: 2019-08-31 | Disposition: A | Discharge status: Home / Self Care | Payer: Commercial Managed Care - PPO | Source: Ambulatory Visit | Attending: Family Medicine | Admitting: Family Medicine

## 2019-08-31 ENCOUNTER — Other Ambulatory Visit: Payer: Self-pay

## 2019-08-31 DIAGNOSIS — Z1231 Encounter for screening mammogram for malignant neoplasm of breast: Secondary | ICD-10-CM | POA: Diagnosis present

## 2019-10-25 ENCOUNTER — Emergency Department
Admission: EM | Admit: 2019-10-25 | Discharge: 2019-10-25 | Disposition: A | Discharge status: Home / Self Care | Payer: Commercial Managed Care - PPO | Attending: Emergency Medicine | Admitting: Emergency Medicine

## 2019-10-25 ENCOUNTER — Other Ambulatory Visit: Payer: Self-pay

## 2019-10-25 DIAGNOSIS — Z79899 Other long term (current) drug therapy: Secondary | ICD-10-CM | POA: Insufficient documentation

## 2019-10-25 DIAGNOSIS — F4321 Adjustment disorder with depressed mood: Secondary | ICD-10-CM

## 2019-10-25 DIAGNOSIS — F410 Panic disorder [episodic paroxysmal anxiety] without agoraphobia: Secondary | ICD-10-CM | POA: Diagnosis present

## 2019-10-25 MED ORDER — ONDANSETRON 4 MG PO TBDP: 4.0000 mg | ORAL_TABLET | Freq: Three times a day (TID) | ORAL | 0 refills | Status: DC | PRN

## 2019-10-25 MED ORDER — ONDANSETRON 4 MG PO TBDP
4.0000 mg | ORAL_TABLET | Freq: Once | ORAL | Status: AC
  Administered 2019-10-25: 4 mg via ORAL
  Filled 2019-10-25: qty 1

## 2019-10-25 NOTE — Discharge Instructions (Signed)
You have been seen in the Emergency Department (ED)  today for a psychiatric complaint.  You have been evaluated by psychiatry and we believe you are safe to be discharged from the hospital.   ° °Please return to the Emergency Department (ED)  immediately if you have ANY thoughts of hurting yourself or anyone else, so that we may help you. ° °Please avoid alcohol and drug use. ° °Follow up with your doctor and/or therapist as soon as possible regarding today's ED  visit.  ° °You may call crisis hotline for [place] at [phone]. ° °
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
## 2019-10-25 NOTE — ED Triage Notes (Addendum)
Pt arrives via [facility] from home anxiety today, death in family, anxiety attack, hx of same ambulatory at house, nausea but no vomiting 137/63 92 HR normal sinus 97 RA 98.73f  Pt reports nausea with vomiting. Pt states she has a hx of anxiety attacks about once/mo and takes prescription medications for it.  Pt states she took the medication "that starts with a D" about an hour ago (around 1130 pm).

## 2020-08-21 ENCOUNTER — Telehealth: Payer: Commercial Managed Care - PPO | Admitting: Family

## 2020-08-21 ENCOUNTER — Encounter (INDEPENDENT_AMBULATORY_CARE_PROVIDER_SITE_OTHER): Payer: Self-pay

## 2020-08-21 DIAGNOSIS — R059 Cough, unspecified: Secondary | ICD-10-CM

## 2020-08-21 MED ORDER — BENZONATATE 100 MG PO CAPS: 100.0000 mg | ORAL_CAPSULE | Freq: Three times a day (TID) | ORAL | 0 refills | Status: DC | PRN

## 2020-08-21 MED ORDER — FLUTICASONE PROPIONATE 50 MCG/ACT NA SUSP: 2.0000 | Freq: Every day | NASAL | 6 refills | Status: DC

## 2020-08-21 NOTE — Progress Notes (Signed)
E-Visit for Corona Virus Screening  Your current symptoms could be consistent with the coronavirus.  Many health care providers can now test patients at their office but not all are.  [place] has multiple testing sites. For information on our COVID testing locations and hours go to [email or web address]  We are enrolling you in our MyChart Home Monitoring for COVID19 . Daily you will receive a questionnaire within the MyChart website. Our COVID 19 response team will be monitoring your responses daily.  Testing Information: The COVID-19 Community Testing sites are testing BY APPOINTMENT ONLY.  You can schedule online at [email or web address]  If you do not have access to a smart phone or computer you may call [phone] for an appointment.   Additional testing sites in the Community:  . For [facility] Testing sites in [place]  https://www.cvs.com/minuteclinic/covid-19-testing  . For Pop-up testing sites in [place]  https://covid19.ncdhhs.gov/about-covid-19/testing/find-my-testing-place/pop-testing-sites  . For [facility] https://[place].gov[place]ounty/human-services/health-department/coronavirus-covid-19-info/covid-19-testing  . For [place] testing in [place] https://[place].gov/[place]-county/human-services/health-department/coronavirus-covid-19-info/covid-19-testing  . For [facility] testing in [place]   https://[facility].care/covidtesting  For  more information about community testing call [phone]   Please quarantine yourself while awaiting your test results. Please stay home for a minimum of 10 days from the first day of illness with improving symptoms and you have had 24 hours of no fever (without the use of Tylenol (Acetaminophen) Motrin (Ibuprofen) or any fever reducing medication).  Also - Do not get tested prior to returning to work because once you have had a positive test the test can stay  positive for more than a month in some cases.   You should wear a mask or cloth face covering over your nose and mouth if you must be around other people or animals, including pets (even at home). Try to stay at least 6 feet away from other people. This will protect the people around you.  Please continue good preventive care measures, including:  frequent hand-washing, avoid touching your face, cover coughs/sneezes, stay out of crowds and keep a 6 foot distance from others.  COVID-19 is a respiratory illness with symptoms that are similar to the flu. Symptoms are typically mild to moderate, but there have been cases of severe illness and death due to the virus.   The following symptoms may appear 2-14 days after exposure: . Fever . Cough . Shortness of breath or difficulty breathing . Chills . Repeated shaking with chills . Muscle pain . Headache . Sore throat . New loss of taste or smell . Fatigue . Congestion or runny nose . Nausea or vomiting . Diarrhea  Go to the nearest hospital ED for assessment if fever/cough/breathlessness are severe or illness seems like a threat to life.  It is vitally important that if you feel that you have an infection such as this virus or any other virus that you stay home and away from places where you may spread it to others.  You should avoid contact with people age 65 and older.   You can use medication such as A prescription cough medication called Tessalon Perles 100 mg. You may take 1-2 capsules every 8 hours as needed for cough and A prescription for Fluticasone nasal spray 2 sprays in each nostril one time per day  You may also take acetaminophen (Tylenol) as needed for fever.  Reduce your risk of any infection by using the same precautions used for avoiding the common cold or flu:  . Wash your hands often with   soap and warm water for at least 20 seconds.  If soap and water are not readily available, use an alcohol-based hand sanitizer with at least  60% alcohol.  . If coughing or sneezing, cover your mouth and nose by coughing or sneezing into the elbow areas of your shirt or coat, into a tissue or into your sleeve (not your hands). . Avoid shaking hands with others and consider head nods or verbal greetings only. . Avoid touching your eyes, nose, or mouth with unwashed hands.  . Avoid close contact with people who are sick. . Avoid places or events with large numbers of people in one location, like concerts or sporting events. . Carefully consider travel plans you have or are making. . If you are planning any travel outside or inside the [place], visit the CDC's Travelers' Health webpage for the latest health notices. . If you have some symptoms but not all symptoms, continue to monitor at home and seek medical attention if your symptoms worsen. . If you are having a medical emergency, call 911.  HOME CARE . Only take medications as instructed by your medical team. . Drink plenty of fluids and get plenty of rest. . A steam or ultrasonic humidifier can help if you have congestion.   GET HELP RIGHT AWAY IF YOU HAVE EMERGENCY WARNING SIGNS** FOR COVID-19. If you or someone is showing any of these signs seek emergency medical care immediately. Call 911 or proceed to your closest emergency facility if: . You develop worsening high fever. . Trouble breathing . Bluish lips or face . Persistent pain or pressure in the chest . New confusion . Inability to wake or stay awake . You cough up blood. . Your symptoms become more severe  **This list is not all possible symptoms. Contact your medical provider for any symptoms that are sever or concerning to you.  MAKE SURE YOU   Understand these instructions.  Will watch your condition.  Will get help right away if you are not doing well or get worse.  Your e-visit answers were reviewed by a board certified advanced clinical practitioner to complete your personal care plan.  Depending on the  condition, your plan could have included both over the counter or prescription medications.  If there is a problem please reply once you have received a response from your provider.  Your safety is important to us.  If you have drug allergies check your prescription carefully.    You can use MyChart to ask questions about today's visit, request a non-urgent call back, or ask for a work or school excuse for 24 hours related to this e-Visit. If it has been greater than 24 hours you will need to follow up with your provider, or enter a new e-Visit to address those concerns. You will get an e-mail in the next two days asking about your experience.  I hope that your e-visit has been valuable and will speed your recovery. Thank you for using e-visits.   Approximately 5 minutes was spent documenting and reviewing patient's chart.     

## 2020-08-22 ENCOUNTER — Encounter (INDEPENDENT_AMBULATORY_CARE_PROVIDER_SITE_OTHER): Payer: Self-pay

## 2020-08-22 ENCOUNTER — Telehealth: Payer: Self-pay

## 2020-08-22 NOTE — Telephone Encounter (Signed)
I tried to reach patient by phone and no answer. Vm left fr patient to callback and also mychart message sent as well

## 2020-08-23 ENCOUNTER — Encounter (INDEPENDENT_AMBULATORY_CARE_PROVIDER_SITE_OTHER): Payer: Self-pay

## 2020-08-23 ENCOUNTER — Telehealth: Payer: Self-pay

## 2020-08-23 NOTE — Telephone Encounter (Signed)
Patient states that she has diarrhea once this morning and that she has eat some fatty foods. Patient also states that she has sob on last night and she put vicks on her chest and started feeling better. Patient was advise on symptoms per protocols as follows:   If diarrhea remains the same: encourage patient to drink oral fluids and bland foods.   Avoid alcohol, spicy foods, caffeine or fatty foods that could make diarrhea worse.   Continue to monitor for signs of dehydration (increased thirst decreased urine output, yellow urine, dry skin, headache or dizziness).   Advise patient to try OTC medication (Imodium, kaopectate, Pepto-Bismol) as per manufacturer's instructions.   If worsening diarrhea occurs and becomes severe (6-7 bowel movements a day): notify PCP   If diarrhea last greater than 7 days: notify PCP   IF SIGNS OF DEHYDRATION OCCUR (INCREASED THIRST, DECREASED URINE OUTPUT, YELLOW URINE, DRY SKIN, HEADACHE OR DIZZINESS) ADVISE PATIENT TO CALL 911 AND SEEK TREATMENT IN THE ED.   Shortness of breath is the same: continue to monitor at home   If symptoms become severe, i.e. shortness of breath at rest, gasping for air, wheezing, CALL 911 AND SEEK TREATMENT IN THE ED  Patient verbalized understanding.

## 2020-08-26 ENCOUNTER — Telehealth: Payer: Self-pay | Admitting: *Deleted

## 2020-08-26 NOTE — Telephone Encounter (Signed)
Pt filled out questionnaire about COVID and set off multiple BPA's that were concerning. I messaged her back but was still concerned. I spoke with her and she states she just thought she was beginning to feel better earlier in day and therefore did too much. Has not rested today and is now exhausted. Encouraged her to rest even when she feels she is better. Drink fluids to prevent dehydration. Verbalizes understanding.

## 2020-08-31 ENCOUNTER — Encounter (INDEPENDENT_AMBULATORY_CARE_PROVIDER_SITE_OTHER): Payer: Self-pay

## 2020-08-31 ENCOUNTER — Telehealth: Payer: Self-pay

## 2020-08-31 NOTE — Telephone Encounter (Signed)
Patient states that she has had 4 episodes of diarrhea since yesterday. Patient advise on symptoms per protocol as follow:   If diarrhea remains the same: encourage patient to drink oral fluids and bland foods.   Avoid alcohol, spicy foods, caffeine or fatty foods that could make diarrhea worse.   Continue to monitor for signs of dehydration (increased thirst decreased urine output, yellow urine, dry skin, headache or dizziness).   Advise patient to try OTC medication (Imodium, kaopectate, Pepto-Bismol) as per manufacturer's instructions.   If worsening diarrhea occurs and becomes severe (6-7 bowel movements a day): notify PCP   If diarrhea last greater than 7 days: notify PCP   IF SIGNS OF DEHYDRATION OCCUR (INCREASED THIRST, DECREASED URINE OUTPUT, YELLOW URINE, DRY SKIN, HEADACHE OR DIZZINESS) ADVISE PATIENT TO CALL 911 AND SEEK TREATMENT IN THE ED

## 2020-11-01 ENCOUNTER — Other Ambulatory Visit: Payer: Self-pay | Admitting: Family Medicine

## 2020-11-01 DIAGNOSIS — Z1231 Encounter for screening mammogram for malignant neoplasm of breast: Secondary | ICD-10-CM

## 2020-11-09 ENCOUNTER — Other Ambulatory Visit: Payer: Self-pay

## 2020-11-09 ENCOUNTER — Ambulatory Visit
Admission: RE | Admit: 2020-11-09 | Discharge: 2020-11-09 | Disposition: A | Discharge status: Home / Self Care | Payer: Commercial Managed Care - PPO | Source: Ambulatory Visit | Attending: Family Medicine | Admitting: Family Medicine

## 2020-11-09 DIAGNOSIS — Z1231 Encounter for screening mammogram for malignant neoplasm of breast: Secondary | ICD-10-CM | POA: Insufficient documentation

## 2021-01-30 ENCOUNTER — Telehealth: Payer: Self-pay

## 2021-01-30 NOTE — Telephone Encounter (Signed)
TC from patient.  States needs CXR for a job.  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 Last CXR was at [facility]. but patient doesn't have documentation from last positive PPD.  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   Patient scheduled for PPD appt d/t no documentation.  PPDR is already scheduled for 02/03/21 with extra time for EPI and HIV/RPR if positive.  Richmond Campbell, RN

## 2021-01-31 ENCOUNTER — Other Ambulatory Visit: Payer: Self-pay

## 2021-01-31 ENCOUNTER — Ambulatory Visit (LOCAL_COMMUNITY_HEALTH_CENTER): Payer: Self-pay

## 2021-01-31 DIAGNOSIS — Z111 Encounter for screening for respiratory tuberculosis: Secondary | ICD-10-CM

## 2021-01-31 NOTE — Progress Notes (Signed)
PPD placed today. Needs PPD for healthcare job. Pt reports exposure to TB from father who was treated for TB. Pt thinks she took TB meds at age 45 but no records. Per pt, Last PPD was 20 yrs ago that she thinks was positive. Jerel Shepherd, RN

## 2021-02-03 ENCOUNTER — Ambulatory Visit (LOCAL_COMMUNITY_HEALTH_CENTER): Payer: Self-pay

## 2021-02-03 ENCOUNTER — Other Ambulatory Visit: Payer: Self-pay

## 2021-02-03 VITALS — Ht 64.0 in | Wt 153.5 lb

## 2021-02-03 DIAGNOSIS — R7611 Nonspecific reaction to tuberculin skin test without active tuberculosis: Secondary | ICD-10-CM

## 2021-02-03 LAB — TB SKIN TEST
Induration: 16 mm
TB Skin Test: POSITIVE

## 2021-02-03 NOTE — Progress Notes (Signed)
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 Epi completed. 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 Explains her father was being treated for TB. Pt declines HIV and RPR today. States she had labs done earlier this month at her PCP Franco Nones, FNP at [facility]). Pt thinks [facility] in [place], [place] has her TB records. Pt explains [facility] may know pt by Juanell Fairly Name "Jocelyn Lamer". ROI signed and RN called [facility] [phone]) to obtain fax # (fax [phone]) and ROI successfully faxed to [facility]. ROI also signed for PCP at [facility] to obtain medical records. Fax sent successfully to [facility]. Pt explains that her PCP before C. Clint Guy, FNP was Dr. Rosemarie Beath and she thinks he has copy of her TB records. ROI signed to obtain these records. Phone call to [facility] Spoke with Corene Cornea who faxed  pt's chest xray from 2013. States no TB records at their practice. Chest xray referral form completed and instructions given for pt to obtain chest xray at [facility]. Contact card for TB coord (M Dorminy, RN) given to pt and advised pt to call with questions. Jerel Shepherd, RN

## 2021-02-06 ENCOUNTER — Other Ambulatory Visit: Payer: Self-pay

## 2021-02-06 ENCOUNTER — Ambulatory Visit
Admission: RE | Admit: 2021-02-06 | Discharge: 2021-02-06 | Disposition: A | Discharge status: Home / Self Care | Payer: Commercial Managed Care - PPO | Attending: Family Medicine | Admitting: Family Medicine

## 2021-02-06 ENCOUNTER — Ambulatory Visit
Admission: RE | Admit: 2021-02-06 | Discharge: 2021-02-06 | Disposition: A | Discharge status: Home / Self Care | Payer: Commercial Managed Care - PPO | Source: Ambulatory Visit | Attending: Family Medicine | Admitting: Family Medicine

## 2021-02-06 DIAGNOSIS — R7611 Nonspecific reaction to tuberculin skin test without active tuberculosis: Secondary | ICD-10-CM

## 2021-02-07 ENCOUNTER — Ambulatory Visit (LOCAL_COMMUNITY_HEALTH_CENTER): Payer: Commercial Managed Care - PPO

## 2021-02-07 DIAGNOSIS — Z23 Encounter for immunization: Secondary | ICD-10-CM | POA: Diagnosis not present

## 2021-02-07 NOTE — Progress Notes (Signed)
02/06/2021  Pt was in Nurse Clinic during son's vaccine visit and asked RN to contact [facility] for past immunization records. ROI was signed and faxed to [facility].   02/07/2021 RN received phone call from [facility] stating no immunization records were located. Pt in Nurse Clinic today and RN explained to pt that no vaccine records were located at [facility]. Pt reports she has no personal record of vaccines. 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 Vaccines given and tolerated well. Updated [facility] copy given and recommended schedule explained. Jerel Shepherd, RN

## 2021-04-14 ENCOUNTER — Other Ambulatory Visit: Payer: Self-pay

## 2021-04-14 ENCOUNTER — Ambulatory Visit (LOCAL_COMMUNITY_HEALTH_CENTER): Payer: Commercial Managed Care - PPO

## 2021-04-14 DIAGNOSIS — Z23 Encounter for immunization: Secondary | ICD-10-CM

## 2021-04-24 IMAGING — MG DIGITAL SCREENING BILAT W/ TOMO W/ CAD
8 series · 8 of 24 positions shown · non-contrast
Comparison: Previous exam(s).

CLINICAL DATA: Screening.

EXAM:
DIGITAL SCREENING BILATERAL MAMMOGRAM WITH TOMO AND CAD

[L CC synth-2D]
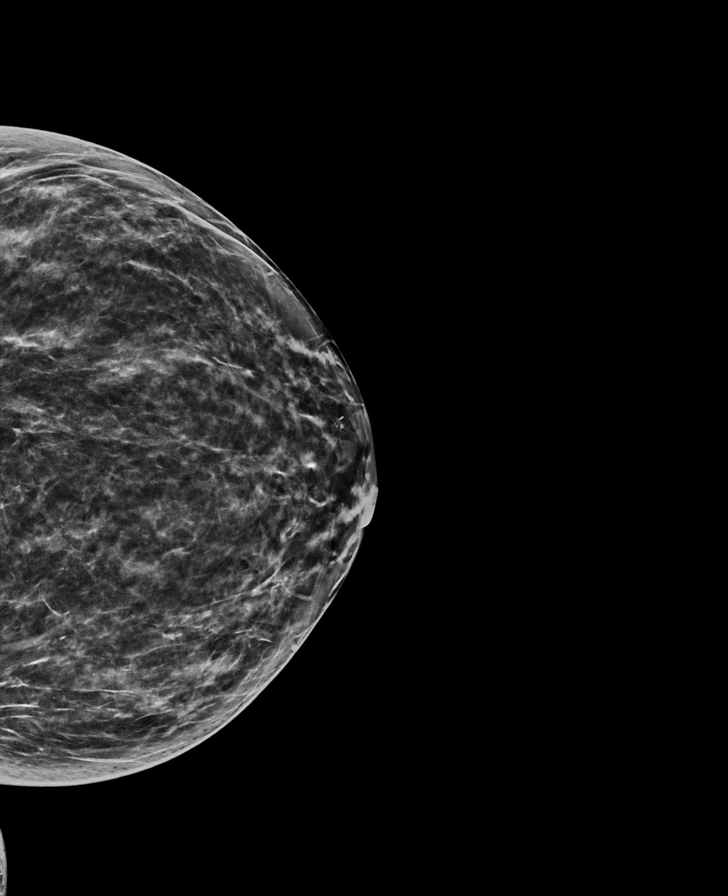

[R CC synth-2D]
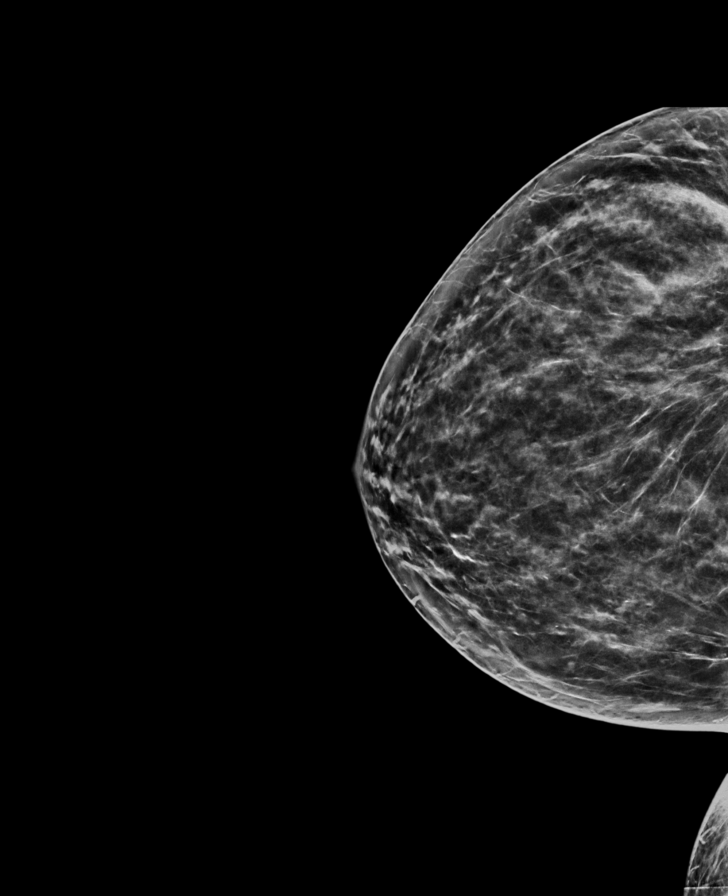

[R MLO synth-2D]
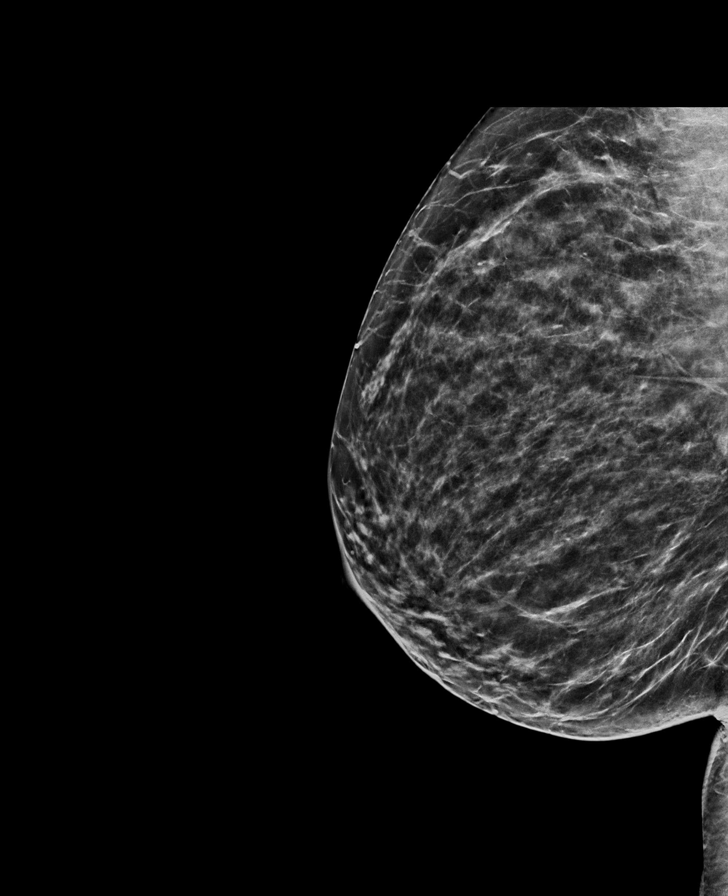

[L MLO synth-2D]
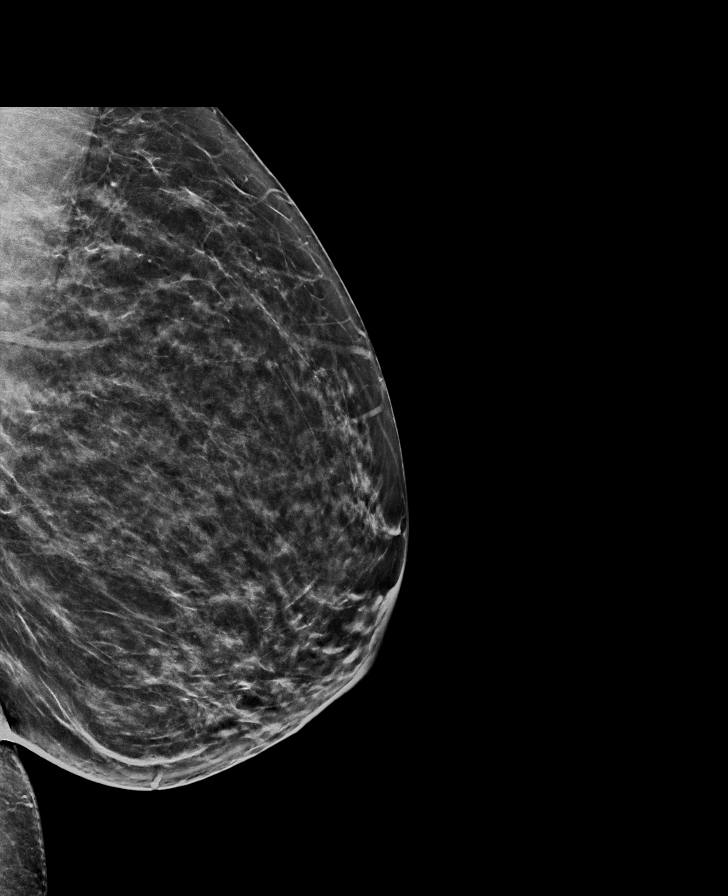

[R CC tomo · tomo slice 35/69.0]
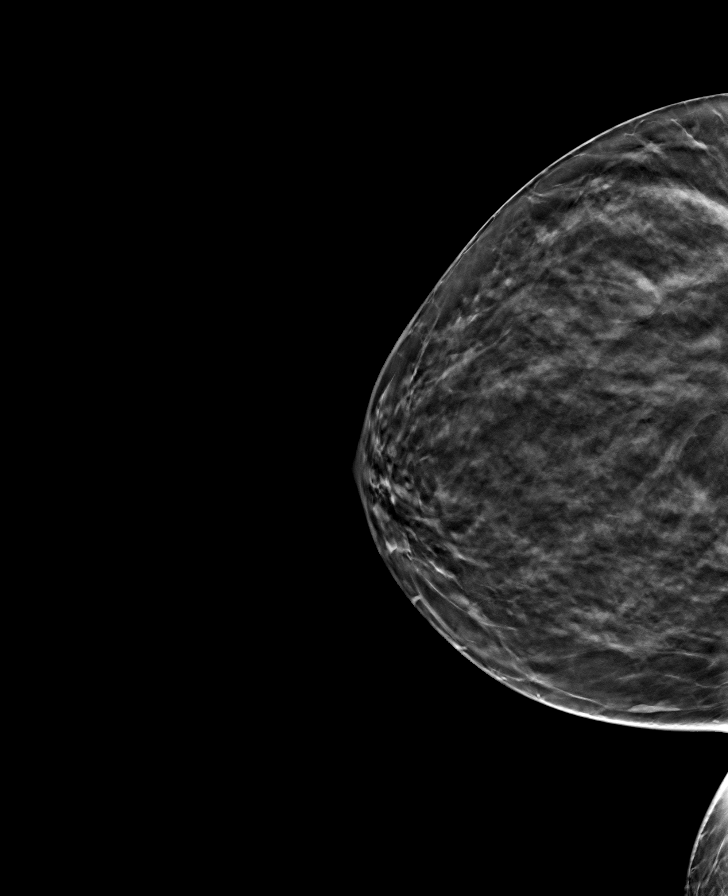

[R MLO tomo · tomo slice 34/67.0]
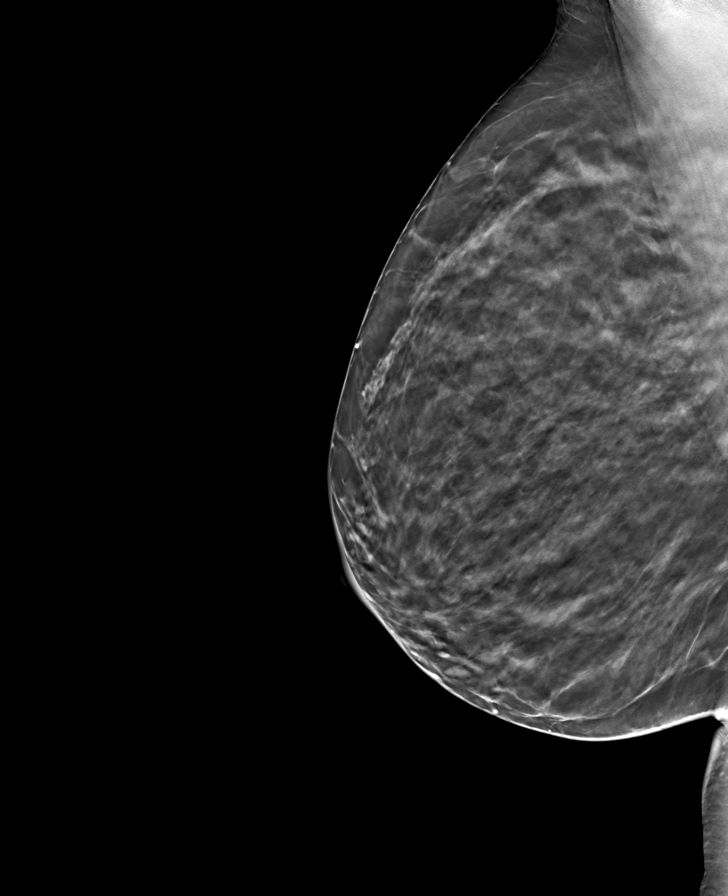

[L MLO tomo · tomo slice 35/69.0]
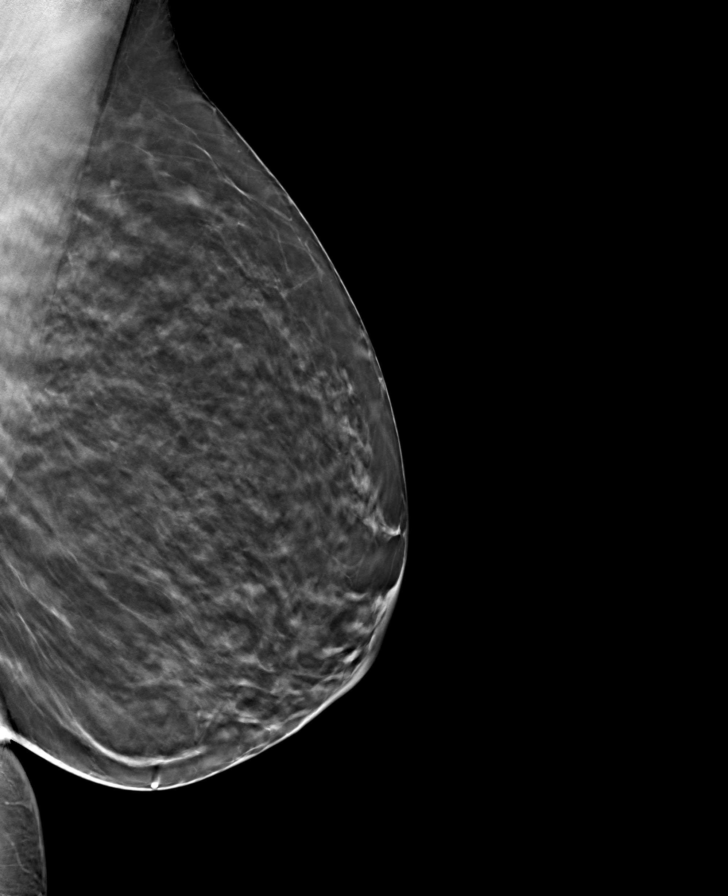

[L CC tomo · tomo slice 35/68.0]
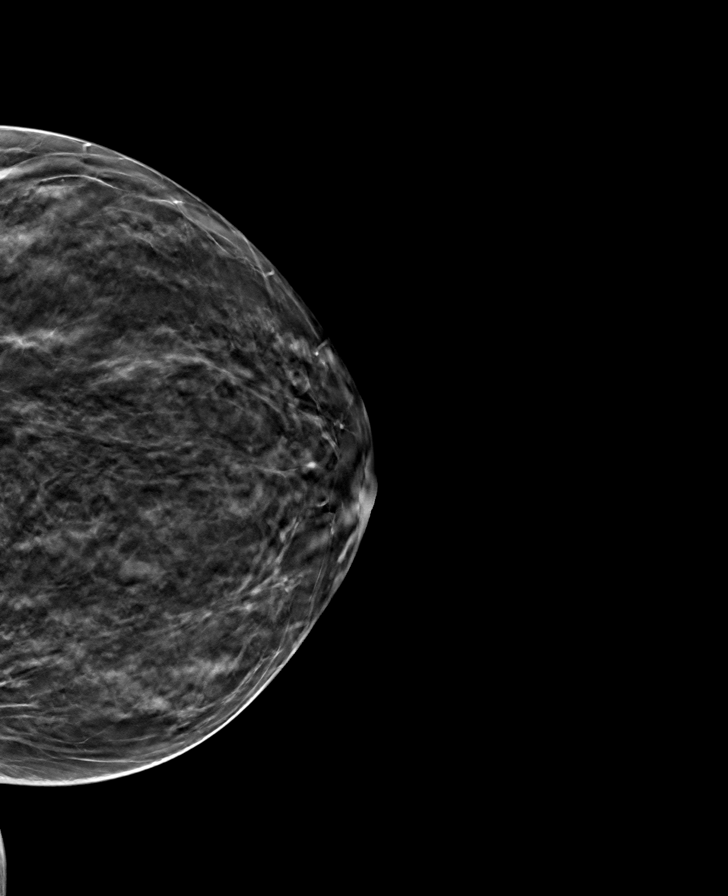

[8 of 24 positions shown; findings below may reference images not displayed]

ACR Breast Density Category c: The breast tissue is heterogeneously
dense, which may obscure small masses.
FINDINGS: There are no findings suspicious for malignancy. Images were
processed with CAD.
IMPRESSION: No mammographic evidence of malignancy. A result letter of this
screening mammogram will be mailed directly to the patient.

RECOMMENDATION:
Screening mammogram in one year. (Code:[id number])

BI-RADS CATEGORY  1: Negative.

## 2021-06-29 ENCOUNTER — Encounter: Payer: Self-pay | Admitting: Nurse Practitioner

## 2021-06-29 ENCOUNTER — Telehealth: Payer: Commercial Managed Care - PPO

## 2021-06-29 ENCOUNTER — Telehealth: Payer: Commercial Managed Care - PPO | Admitting: Nurse Practitioner

## 2021-06-29 DIAGNOSIS — R6889 Other general symptoms and signs: Secondary | ICD-10-CM | POA: Diagnosis not present

## 2021-06-29 MED ORDER — OSELTAMIVIR PHOSPHATE 75 MG PO CAPS: 75.0000 mg | ORAL_CAPSULE | Freq: Two times a day (BID) | ORAL | 0 refills | Status: AC

## 2021-06-29 NOTE — Progress Notes (Signed)

## 2021-06-29 NOTE — Progress Notes (Signed)
I have spent 5 minutes in review of e-visit questionnaire, review and updating patient chart, medical decision making and response to patient.  ° °Amanda Sparks W Shadie Sweatman, NP ° °  °

## 2021-07-10 ENCOUNTER — Encounter: Payer: Self-pay | Admitting: Emergency Medicine

## 2021-07-10 ENCOUNTER — Other Ambulatory Visit: Payer: Self-pay

## 2021-07-10 ENCOUNTER — Ambulatory Visit
Admission: EM | Admit: 2021-07-10 | Discharge: 2021-07-10 | Disposition: A | Discharge status: Home / Self Care | Payer: Commercial Managed Care - PPO | Attending: Physician Assistant | Admitting: Physician Assistant

## 2021-07-10 DIAGNOSIS — N3090 Cystitis, unspecified without hematuria: Secondary | ICD-10-CM | POA: Diagnosis not present

## 2021-07-10 LAB — URINALYSIS, COMPLETE (UACMP) WITH MICROSCOPIC
Bilirubin Urine: NEGATIVE
Glucose, UA: NEGATIVE mg/dL
Ketones, ur: NEGATIVE mg/dL
Nitrite: NEGATIVE
Protein, ur: 30 mg/dL — AB
Specific Gravity, Urine: 1.025 (ref 1.005–1.030)
WBC, UA: >50 WBC/hpf (ref 0–5)
pH: 7 (ref 5.0–8.0)

## 2021-07-10 MED ORDER — FLUCONAZOLE 150 MG PO TABS: 150.0000 mg | ORAL_TABLET | Freq: Once | ORAL | 0 refills | Status: AC

## 2021-07-10 MED ORDER — PHENAZOPYRIDINE HCL 200 MG PO TABS: 200.0000 mg | ORAL_TABLET | Freq: Three times a day (TID) | ORAL | 0 refills | Status: AC

## 2021-07-10 MED ORDER — NITROFURANTOIN MONOHYD MACRO 100 MG PO CAPS: 100.0000 mg | ORAL_CAPSULE | Freq: Two times a day (BID) | ORAL | 0 refills | Status: DC

## 2021-07-10 NOTE — ED Provider Notes (Signed)
[facility]    CSN: [id number] Arrival date & time: 07/10/21  0949      History   Chief Complaint Chief Complaint  Patient presents with   Dysuria    HPI Amanda Sparks is a 45 y.o. female who presents with urinary frequency, dysuria, 2-3 days ago, and R low pain since yesterday. This am saw pink matter in the tissue. Denies fever. She has not had UTI in several years. Denies having abnormal vaginal discharge.     Past Medical History:  Diagnosis Date   Anemia    Family history of adverse reaction to anesthesia    1st cousin-never woke back up after surgery   GERD (gastroesophageal reflux disease)    h/o   History of palpitations     There are no problems to display for this patient.   Past Surgical History:  Procedure Laterality Date   ABLATION     CARPAL TUNNEL RELEASE Right 09/30/2018   Procedure: CARPAL TUNNEL RELEASE;  Surgeon: Juanell Fairly, MD;  Location: [facility];  Service: Orthopedics;  Laterality: Right;    OB History   No obstetric history on file.      Home Medications    Prior to Admission medications   Medication Sig Start Date End Date Taking? Authorizing Provider  Cholecalciferol (VITAMIN D3) 75 MCG (3000 UT) TABS Take 3,000 Units by mouth daily.   Yes [provider]  fluconazole (DIFLUCAN) 150 MG tablet Take 1 tablet (150 mg total) by mouth once for 1 dose. 07/10/21 07/10/21 Yes Rodriguez-Southworth, Nettie Elm, [name]  nitrofurantoin, macrocrystal-monohydrate, (MACROBID) 100 MG capsule Take 1 capsule (100 mg total) by mouth 2 (two) times daily. 07/10/21  Yes Rodriguez-Southworth, Nettie Elm, [name]  phenazopyridine (PYRIDIUM) 200 MG tablet Take 1 tablet (200 mg total) by mouth 3 (three) times daily. 07/10/21  Yes Rodriguez-Southworth, Nettie Elm, [name]  Cholecalciferol (VITAMIN D3) 1.25 MG (50000 UT) CAPS Take 1 tablet by mouth once a week. X 4 weeks    [provider]    Family History Family History  Problem  Relation Age of Onset   Hypertension Mother    Diabetes Father    Breast cancer Maternal Grandmother     Social History Social History   Tobacco Use   Smoking status: Never   Smokeless tobacco: Never  Vaping Use   Vaping Use: Never used  Substance Use Topics   Alcohol use: No    Alcohol/week: 0.0 standard drinks   Drug use: No     Allergies   Bactrim [sulfamethoxazole-trimethoprim]   Review of Systems Review of Systems   Physical Exam Triage Vital Signs ED Triage Vitals  Enc Vitals Group     BP 07/10/21 1033 113/66     Pulse Rate 07/10/21 1033 72     Resp 07/10/21 1033 18     Temp 07/10/21 1033 98.1 F (36.7 C)     Temp Source 07/10/21 1033 Oral     SpO2 07/10/21 1033 100 %     Weight 07/10/21 1031 153 lb 7 oz (69.6 kg)     Height 07/10/21 1031 5\' 4"  (1.626 m)     Head Circumference --      Peak Flow --      Pain Score 07/10/21 1030 3     Pain Loc --      Pain Edu? --      Excl. in [place]? --    No data found.  Updated Vital Signs BP 113/66 (BP Location: Right Arm)  Pulse 72   Temp 98.1 F (36.7 C) (Oral)   Resp 18   Ht 5\' 4"  (1.626 m)   Wt 153 lb 7 oz (69.6 kg)   LMP 06/19/2021 (Approximate)   SpO2 100%   BMI 26.34 kg/m   Visual Acuity Right Eye Distance:   Left Eye Distance:   Bilateral Distance:    Right Eye Near:   Left Eye Near:    Bilateral Near:     Physical Exam Physical Exam Vitals and nursing note reviewed.  Constitutional:      General: She is not in acute distress.    Appearance: She is not toxic-appearing.  HENT:     Head: Normocephalic.     Right Ear: External ear normal.     Left Ear: External ear normal.  Eyes:     General: No scleral icterus.    Conjunctiva/sclera: Conjunctivae normal.  Pulmonary:     Effort: Pulmonary effort is normal.  Abdominal:     General: Bowel sounds are normal.     Palpations: Abdomen is soft. There is no mass.     Tenderness: There is no guarding or rebound.     Comments: - CVA  tenderness   Musculoskeletal:        General: Normal range of motion.     Cervical back: Neck supple.     Comments: BACK- has mild muscular tenderness on area of complaint with palpation of R SI region.  Skin:    General: Skin is warm and dry.     Findings: No rash.  Neurological:     Mental Status: She is alert and oriented to person, place, and time.     Gait: Gait normal.  Psychiatric:        Mood and Affect: Mood normal.        Behavior: Behavior normal.        Thought Content: Thought content normal.        Judgment: Judgment normal.    UC Treatments / Results  Labs (all labs ordered are listed, but only abnormal results are displayed) Labs Reviewed  URINALYSIS, COMPLETE (UACMP) WITH MICROSCOPIC - Abnormal; Notable for the following components:      Result Value   APPearance HAZY (*)    Hgb urine dipstick MODERATE (*)    Protein, ur 30 (*)    Leukocytes,Ua SMALL (*)    Bacteria, UA FEW (*)    All other components within normal limits  URINE CULTURE    EKG   Radiology No results found.  Procedures Procedures (including critical care time)  Medications Ordered in UC Medications - No data to display  Initial Impression / Assessment and Plan / UC Course  I have reviewed the triage vital signs and the nursing notes. Pertinent labs results that were available during my care of the patient were reviewed by me and considered in my medical decision making (see chart for details). UTI Urine culture was sent out and we will call her if we need to change her medication. I prescribed her Macrobid, Pyridium, and Diflucan as noted.     Final Clinical Impressions(s) / UC Diagnoses   Final diagnoses:  Cystitis   Discharge Instructions   None    ED Prescriptions     Medication Sig Dispense Auth. Provider   nitrofurantoin, macrocrystal-monohydrate, (MACROBID) 100 MG capsule Take 1 capsule (100 mg total) by mouth 2 (two) times daily. 14 capsule Rodriguez-Southworth,  Tasheba Henson, [name]   phenazopyridine (PYRIDIUM) 200 MG tablet  Take 1 tablet (200 mg total) by mouth 3 (three) times daily. 6 tablet Rodriguez-Southworth, Nettie Elm, [name]   fluconazole (DIFLUCAN) 150 MG tablet Take 1 tablet (150 mg total) by mouth once for 1 dose. 1 tablet Rodriguez-Southworth, Nettie Elm, [name]      PDMP not reviewed this encounter.   Garey Ham, [name] 07/10/21 1105

## 2021-07-10 NOTE — ED Triage Notes (Signed)
Pt c/o urinary frequency, dysuria, hematuria, right lower back pain. Started 2-3 days ago. Denies fever.

## 2021-07-12 LAB — URINE CULTURE: Culture: >=100000 — AB

## 2021-08-23 ENCOUNTER — Ambulatory Visit: Payer: Commercial Managed Care - PPO

## 2021-10-30 ENCOUNTER — Ambulatory Visit
Admission: EM | Admit: 2021-10-30 | Discharge: 2021-10-30 | Disposition: A | Discharge status: Home / Self Care | Payer: Commercial Managed Care - PPO | Attending: Physician Assistant | Admitting: Physician Assistant

## 2021-10-30 ENCOUNTER — Other Ambulatory Visit: Payer: Self-pay

## 2021-10-30 DIAGNOSIS — R42 Dizziness and giddiness: Secondary | ICD-10-CM | POA: Insufficient documentation

## 2021-10-30 DIAGNOSIS — Z20822 Contact with and (suspected) exposure to covid-19: Secondary | ICD-10-CM | POA: Insufficient documentation

## 2021-10-30 DIAGNOSIS — R202 Paresthesia of skin: Secondary | ICD-10-CM | POA: Insufficient documentation

## 2021-10-30 DIAGNOSIS — J029 Acute pharyngitis, unspecified: Secondary | ICD-10-CM | POA: Insufficient documentation

## 2021-10-30 DIAGNOSIS — R5383 Other fatigue: Secondary | ICD-10-CM | POA: Diagnosis not present

## 2021-10-30 DIAGNOSIS — M79606 Pain in leg, unspecified: Secondary | ICD-10-CM | POA: Diagnosis not present

## 2021-10-30 LAB — COMPREHENSIVE METABOLIC PANEL
ALT: 15 U/L (ref 0–44)
AST: 16 U/L (ref 15–41)
Albumin: 4 g/dL (ref 3.5–5.0)
Alkaline Phosphatase: 37 U/L — ABNORMAL LOW (ref 38–126)
Anion gap: 9 (ref 5–15)
BUN: 12 mg/dL (ref 6–20)
CO2: 23 mmol/L (ref 22–32)
Calcium: 8.6 mg/dL — ABNORMAL LOW (ref 8.9–10.3)
Chloride: 105 mmol/L (ref 98–111)
Creatinine, Ser: 0.68 mg/dL (ref 0.44–1.00)
GFR, Estimated: >60 mL/min (ref >60)
Glucose, Bld: 88 mg/dL (ref 70–99)
Potassium: 3.7 mmol/L (ref 3.5–5.1)
Sodium: 137 mmol/L (ref 135–145)
Total Bilirubin: 0.6 mg/dL (ref 0.3–1.2)
Total Protein: 7.6 g/dL (ref 6.5–8.1)

## 2021-10-30 LAB — CBC WITH DIFFERENTIAL/PLATELET
Abs Immature Granulocytes: 0.01 10*3/uL (ref 0.00–0.07)
Basophils Absolute: 0.1 10*3/uL (ref 0.0–0.1)
Basophils Relative: 1 %
Eosinophils Absolute: 0.1 10*3/uL (ref 0.0–0.5)
Eosinophils Relative: 2 %
HCT: 40 % (ref 36.0–46.0)
Hemoglobin: 13 g/dL (ref 12.0–15.0)
Immature Granulocytes: 0 %
Lymphocytes Relative: 30 %
Lymphs Abs: 1.4 10*3/uL (ref 0.7–4.0)
MCH: 27.3 pg (ref 26.0–34.0)
MCHC: 32.5 g/dL (ref 30.0–36.0)
MCV: 84 fL (ref 80.0–100.0)
Monocytes Absolute: 0.4 10*3/uL (ref 0.1–1.0)
Monocytes Relative: 8 %
Neutro Abs: 2.8 10*3/uL (ref 1.7–7.7)
Neutrophils Relative %: 59 %
Platelets: 266 10*3/uL (ref 150–400)
RBC: 4.76 MIL/uL (ref 3.87–5.11)
RDW: 13.1 % (ref 11.5–15.5)
WBC: 4.7 10*3/uL (ref 4.0–10.5)
nRBC: 0 % (ref 0.0–0.2)

## 2021-10-30 LAB — GROUP A STREP BY PCR: Group A Strep by PCR: NOT DETECTED

## 2021-10-30 LAB — MAGNESIUM: Magnesium: 1.8 mg/dL (ref 1.7–2.4)

## 2021-10-30 NOTE — ED Provider Notes (Signed)
[facility]    CSN: [id number] Arrival date & time: 10/30/21  0912      History   Chief Complaint Chief Complaint  Patient presents with   Leg Pain    HPI Amanda Sparks is a 46 y.o. female presenting for myriad of symptoms.  Patient first reports that she has had dizziness and fatigue intermittently over the past 3 to 4 days.  Patient reports 3 to 4 days ago when she got up from a seated position she experienced numbness and weakness of the entire right lower extremity.  Reports she did not fall.  Patient says it felt like her leg was asleep.  Denies sitting for a long period of time previous to this.  Patient reports it lasted for about 3 minutes and then the leg felt normal again.  Not associated with any pain of any sort.  Denies sharp shooting pain down the leg and no reported back pain.  Patient also reports intermittent numbness of the left upper extremity.  Reports she felt the numbness yesterday which lasted for about a minute and then went away.  She is not experiencing any numbness or weakness or pain at this time.  Patient says today she started to have a scratchy throat which she attributed to allergies but given the other symptoms that she should be checked out.  Patient has history of anemia.  Reports she is supposed to be taking an iron supplement but does not always take it.  She is concerned about anemia causing her symptoms.  She denies any other chronic medical conditions and does not take any routine medications.  Denies any similar problems in the past.  No family medical history of MS or other neuromuscular or autoimmune diseases.  HPI  Past Medical History:  Diagnosis Date   Anemia    Family history of adverse reaction to anesthesia    1st cousin-never woke back up after surgery   GERD (gastroesophageal reflux disease)    h/o   History of palpitations     There are no problems to display for this patient.   Past Surgical History:  Procedure  Laterality Date   ABLATION     CARPAL TUNNEL RELEASE Right 09/30/2018   Procedure: CARPAL TUNNEL RELEASE;  Surgeon: Thornton Park, MD;  Location: [facility];  Service: Orthopedics;  Laterality: Right;    OB History   No obstetric history on file.      Home Medications    Prior to Admission medications   Medication Sig Start Date End Date Taking? Authorizing Provider  Cholecalciferol (VITAMIN D3) 1.25 MG (50000 UT) CAPS Take 1 tablet by mouth once a week. X 4 weeks    [provider]  Cholecalciferol (VITAMIN D3) 75 MCG (3000 UT) TABS Take 3,000 Units by mouth daily.    [provider]  nitrofurantoin, macrocrystal-monohydrate, (MACROBID) 100 MG capsule Take 1 capsule (100 mg total) by mouth 2 (two) times daily. 07/10/21   Rodriguez-Southworth, Sunday Spillers, [name]  phenazopyridine (PYRIDIUM) 200 MG tablet Take 1 tablet (200 mg total) by mouth 3 (three) times daily. 07/10/21   Rodriguez-Southworth, Sunday Spillers, [name]    Family History Family History  Problem Relation Age of Onset   Hypertension Mother    Diabetes Father    Breast cancer Maternal Grandmother     Social History Social History   Tobacco Use   Smoking status: Never   Smokeless tobacco: Never  Vaping Use   Vaping Use: Never used  Substance Use  Topics   Alcohol use: No    Alcohol/week: 0.0 standard drinks   Drug use: No     Allergies   Bactrim [sulfamethoxazole-trimethoprim]   Review of Systems Review of Systems  Constitutional:  Positive for fatigue.  HENT:  Positive for sore throat (mild, scratchy). Negative for congestion.   Eyes:  Negative for photophobia and visual disturbance.  Respiratory:  Negative for cough and shortness of breath.   Cardiovascular:  Negative for chest pain and palpitations.  Gastrointestinal:  Negative for abdominal pain, diarrhea and nausea.  Musculoskeletal:  Negative for back pain, joint swelling, neck pain and neck stiffness.  Neurological:  Positive for dizziness  (not currently), weakness (Right leg weakness, resolved), numbness (Right leg numbness, resolved) and headaches (mild, intermittent). Negative for tremors, syncope, facial asymmetry and speech difficulty.  Psychiatric/Behavioral:  Negative for confusion.     Physical Exam Triage Vital Signs ED Triage Vitals  Enc Vitals Group     BP 10/30/21 0918 125/73     Pulse Rate 10/30/21 0918 74     Resp 10/30/21 0918 16     Temp 10/30/21 0918 98.9 F (37.2 C)     Temp Source 10/30/21 0918 Oral     SpO2 10/30/21 0918 100 %     Weight --      Height --      Head Circumference --      Peak Flow --      Pain Score 10/30/21 0919 0     Pain Loc --      Pain Edu? --      Excl. in [place]? --    No data found.  Updated Vital Signs BP 125/73 (BP Location: Left Arm)    Pulse 74    Temp 98.9 F (37.2 C) (Oral)    Resp 16    LMP 10/06/2021    SpO2 100%      Physical Exam Vitals and nursing note reviewed.  Constitutional:      General: She is not in acute distress.    Appearance: Normal appearance. She is not ill-appearing or toxic-appearing.  HENT:     Head: Normocephalic and atraumatic.     Right Ear: Tympanic membrane, ear canal and external ear normal.     Left Ear: Tympanic membrane, ear canal and external ear normal.     Nose: Nose normal.     Mouth/Throat:     Mouth: Mucous membranes are moist.     Pharynx: Oropharynx is clear.  Eyes:     General: No scleral icterus.       Right eye: No discharge.        Left eye: No discharge.     Conjunctiva/sclera: Conjunctivae normal.  Cardiovascular:     Rate and Rhythm: Normal rate and regular rhythm.     Heart sounds: Normal heart sounds.  Pulmonary:     Effort: Pulmonary effort is normal. No respiratory distress.     Breath sounds: Normal breath sounds.  Musculoskeletal:     Cervical back: Normal range of motion and neck supple. No rigidity.  Skin:    General: Skin is dry.  Neurological:     General: No focal deficit present.     Mental  Status: She is alert and oriented to person, place, and time. Mental status is at baseline.     Motor: No weakness.     Coordination: Coordination normal.     Gait: Gait normal.     Comments: 5/5 strength bilat  upper and lower exts  Psychiatric:        Mood and Affect: Mood normal.        Behavior: Behavior normal.        Thought Content: Thought content normal.     UC Treatments / Results  Labs (all labs ordered are listed, but only abnormal results are displayed) Labs Reviewed  COMPREHENSIVE METABOLIC PANEL - Abnormal; Notable for the following components:      Result Value   Calcium 8.6 (*)    Alkaline Phosphatase 37 (*)    All other components within normal limits  GROUP A STREP BY PCR  SARS CORONAVIRUS 2 (TAT 6-24 HRS)  CBC WITH DIFFERENTIAL/PLATELET  MAGNESIUM    EKG   Radiology No results found.  Procedures Procedures (including critical care time)  Medications Ordered in UC Medications - No data to display  Initial Impression / Assessment and Plan / UC Course  I have reviewed the triage vital signs and the nursing notes.  Pertinent labs & imaging results that were available during my care of the patient were reviewed by me and considered in my medical decision making (see chart for details).   46 year old female presenting with numerous symptoms including dizziness and fatigue intermittently for the past few days.  Patient also reports an episode of numbness and weakness of the right lower extremity that lasted 3 minutes and has since resolved.  This occurred a few days ago.  Additionally reports mild numbness of the left upper extremity yesterday which resolved.  Not experiencing these symptoms presently.  Also reporting scratchy throat that began today.  Reports mild headaches as well.  No vision changes or syncope.  No associated chest pain, palpitations or shortness of breath.  Vital signs all normal and stable patient is overall well-appearing.  Her exam is  very reassuring today.  She has no musculoskeletal tenderness.  5 out of 5 strength bilateral upper and lower extremities.  Full range of motion of neck and back.  Normal cranial nerve exam.  HEENT exam normal.  Chest clear auscultation heart regular rate and rhythm.  PCR COVID test and PCR strep test obtained given symptoms of fatigue, dizziness and sore/scratchy throat.  Advised patient occasionally viruses can cause some on symptoms such as numbness and weakness.  CBC, CMP and magnesium also ordered to see if patient has any signs of anemia or electrolyte derangement or kidney or liver issues which could be contributing to her other symptoms.  Negative strep.  Pending COVID.  Current CDC guidance, isolation protocol and ED precautions reviewed, positive.  All labs are reassuring.  No significant abnormal values.  Patient not anemic and all electrolytes are within normal limits as well as kidney and liver function.  Discussed this with patient.  Advised patient close monitoring of symptoms and reviewed when to follow-up with PCP and emergency department.  At this time, advised her to go home and rest increase fluid intake.  If continued numbness, weakness, dizziness, fatigue and headaches, should seek reexamination to see if she needs imaging of head.  Reviewed going to ED for any severe acute worsening of symptoms, otherwise follow-up with PCP.  Also advised to go ahead and start antihistamine for the scratchy throat.  Likely related to allergies.  Increase rest and fluids.  Work note given.   Final Clinical Impressions(s) / UC Diagnoses   Final diagnoses:  Paresthesias  Other fatigue  Sore throat  Dizziness     Discharge Instructions      -  Your labs are all very reassuring.  Kidney and liver functions normal and your electrolytes are all normal.  Also you are not anemic based on your lab work today.  However this does not mean that you should stop taking your iron supplement.  Continue  it. -Strep pending, but expected to be negative. Will call if + - COVID test is pending.  If you end up being positive for COVID you should isolate 5 days and wear mask for 5 days. - As we discussed, sometimes viral illnesses can cause strange symptoms such as numbness and tingling as well as dizziness.  In that case your symptoms should be mild and effectively resolved within a few days. - There are other conditions which require further work-up if symptoms were to continue.  As we discussed, if the symptoms continue to bother you over the next week or if they worsen they need to be seen again.  You may need an MRI of your brain. - If you were to have vision changes or you have numbness or tingling or weakness that does not resolve pretty, quickly, associated pain, severe worsening headaches, increased dizziness, vomiting, increased fatigue, falls or feeling faint/passing out, you need to call 911 or Take to the ER for More Immediate Work-Up.     ED Prescriptions   None    I have reviewed the PDMP during this encounter.   Danton Clap, [name] 10/30/21 1052

## 2021-10-30 NOTE — Discharge Instructions (Addendum)
-  Your labs are all very reassuring.  Kidney and liver functions normal and your electrolytes are all normal.  Also you are not anemic based on your lab work today.  However this does not mean that you should stop taking your iron supplement.  Continue it. ?-Strep pending, but expected to be negative. Will call if + ?- COVID test is pending.  If you end up being positive for COVID you should isolate 5 days and wear mask for 5 days. ?- As we discussed, sometimes viral illnesses can cause strange symptoms such as numbness and tingling as well as dizziness.  In that case your symptoms should be mild and effectively resolved within a few days. ?- There are other conditions which require further work-up if symptoms were to continue.  As we discussed, if the symptoms continue to bother you over the next week or if they worsen they need to be seen again.  You may need an MRI of your brain. ?- If you were to have vision changes or you have numbness or tingling or weakness that does not resolve pretty, quickly, associated pain, severe worsening headaches, increased dizziness, vomiting, increased fatigue, falls or feeling faint/passing out, you need to call 911 or Take to the ER for More Immediate Work-Up. ?

## 2021-10-30 NOTE — ED Triage Notes (Signed)
Pt presents to the office for right leg pain for several days. Pt reports leg feel numb. ?

## 2021-10-31 LAB — SARS CORONAVIRUS 2 (TAT 6-24 HRS): SARS Coronavirus 2: NEGATIVE

## 2022-01-22 ENCOUNTER — Ambulatory Visit
Admission: EM | Admit: 2022-01-22 | Discharge: 2022-01-22 | Disposition: A | Discharge status: Home / Self Care | Payer: Commercial Managed Care - PPO | Attending: Physician Assistant | Admitting: Physician Assistant

## 2022-01-22 DIAGNOSIS — R519 Headache, unspecified: Secondary | ICD-10-CM | POA: Insufficient documentation

## 2022-01-22 DIAGNOSIS — R6889 Other general symptoms and signs: Secondary | ICD-10-CM | POA: Insufficient documentation

## 2022-01-22 DIAGNOSIS — Z79899 Other long term (current) drug therapy: Secondary | ICD-10-CM | POA: Insufficient documentation

## 2022-01-22 DIAGNOSIS — R197 Diarrhea, unspecified: Secondary | ICD-10-CM | POA: Insufficient documentation

## 2022-01-22 DIAGNOSIS — R42 Dizziness and giddiness: Secondary | ICD-10-CM | POA: Diagnosis not present

## 2022-01-22 DIAGNOSIS — Z20822 Contact with and (suspected) exposure to covid-19: Secondary | ICD-10-CM | POA: Insufficient documentation

## 2022-01-22 LAB — RESP PANEL BY RT-PCR (FLU A&B, COVID) ARPGX2
Influenza A by PCR: NEGATIVE
Influenza B by PCR: NEGATIVE
SARS Coronavirus 2 by RT PCR: NEGATIVE

## 2022-01-22 MED ORDER — PROMETHAZINE HCL 25 MG PO TABS: 25.0000 mg | ORAL_TABLET | Freq: Four times a day (QID) | ORAL | 0 refills | Status: DC | PRN

## 2022-01-22 NOTE — Discharge Instructions (Addendum)
Take pepto to help with the diarrhea

## 2022-01-22 NOTE — ED Triage Notes (Signed)
Pt c/o lightheaded, dizziness, nausea, headache, diarrhea x2days.  Pt took a home covid test and it was negative.   Pt was around a doctor who was sick with Covid.

## 2022-01-22 NOTE — ED Provider Notes (Signed)
[facility]    CSN: [id number] Arrival date & time: 01/22/22  0901      History   Chief Complaint Chief Complaint  Patient presents with   Headache   Dizziness   Diarrhea    HPI Amanda Sparks is a 46 y.o. female who presents with HA, nausea, lightheaded and dizzy x 2 days. Developed diarrhea yesterday pm and went so 4 times since. Denies blood in the stool. Denies being out of the country or on antibiotics in the past month. Was around a doctor who was sick with Covid.  Had a negative home covid test yesterday.     Past Medical History:  Diagnosis Date   Anemia    Family history of adverse reaction to anesthesia    1st cousin-never woke back up after surgery   GERD (gastroesophageal reflux disease)    h/o   History of palpitations     There are no problems to display for this patient.   Past Surgical History:  Procedure Laterality Date   ABLATION     CARPAL TUNNEL RELEASE Right 09/30/2018   Procedure: CARPAL TUNNEL RELEASE;  Surgeon: Thornton Park, MD;  Location: [facility];  Service: Orthopedics;  Laterality: Right;    OB History   No obstetric history on file.      Home Medications    Prior to Admission medications   Medication Sig Start Date End Date Taking? Authorizing Provider  promethazine (PHENERGAN) 25 MG tablet Take 1 tablet (25 mg total) by mouth every 6 (six) hours as needed for nausea or vomiting. 01/22/22  Yes Rodriguez-Southworth, Sunday Spillers, [name]  Cholecalciferol (VITAMIN D3) 1.25 MG (50000 UT) CAPS Take 1 tablet by mouth once a week. X 4 weeks    [provider]  phenazopyridine (PYRIDIUM) 200 MG tablet Take 1 tablet (200 mg total) by mouth 3 (three) times daily. 07/10/21   Rodriguez-Southworth, Sunday Spillers, [name]    Family History Family History  Problem Relation Age of Onset   Hypertension Mother    Diabetes Father    Breast cancer Maternal Grandmother     Social History Social History   Tobacco Use   Smoking  status: Never   Smokeless tobacco: Never  Vaping Use   Vaping Use: Never used  Substance Use Topics   Alcohol use: No    Alcohol/week: 0.0 standard drinks   Drug use: No     Allergies   Bactrim [sulfamethoxazole-trimethoprim]   Review of Systems Review of Systems  Constitutional:  Positive for appetite change. Negative for fever.  HENT:  Negative for congestion, rhinorrhea and sore throat.   Respiratory:  Negative for cough.   Gastrointestinal:  Positive for diarrhea and nausea. Negative for abdominal pain and vomiting.  Musculoskeletal:  Negative for myalgias.  Skin:  Negative for rash.  Neurological:  Positive for dizziness, weakness, light-headedness and headaches.    Physical Exam Triage Vital Signs ED Triage Vitals  Enc Vitals Group     BP 01/22/22 0918 112/60     Pulse Rate 01/22/22 0918 70     Resp 01/22/22 0918 18     Temp 01/22/22 0918 98.7 F (37.1 C)     Temp Source 01/22/22 0918 Oral     SpO2 01/22/22 0918 98 %     Weight 01/22/22 0916 158 lb (71.7 kg)     Height 01/22/22 0916 5\' 4"  (1.626 m)     Head Circumference --      Peak Flow --  Pain Score 01/22/22 0916 6     Pain Loc --      Pain Edu? --      Excl. in [place]? --    Orthostatic VS for the past 24 hrs:  BP- Lying Pulse- Lying BP- Sitting Pulse- Sitting BP- Standing at 0 minutes Pulse- Standing at 0 minutes  01/22/22 1004 117/43 63 117/42 72 125/79 78    Updated Vital Signs BP 112/60 (BP Location: Left Arm)   Pulse 70   Temp 98.7 F (37.1 C) (Oral)   Resp 18   Ht 5\' 4"  (1.626 m)   Wt 158 lb (71.7 kg)   LMP 01/21/2022   SpO2 98%   BMI 27.12 kg/m   Visual Acuity Right Eye Distance:   Left Eye Distance:   Bilateral Distance:    Right Eye Near:   Left Eye Near:    Bilateral Near:      Physical Exam Vitals signs and nursing note reviewed.  Constitutional:      General: She is not in acute distress.    Appearance: Normal appearance. She is not ill-appearing, toxic-appearing or  diaphoretic.  HENT:     Head: Normocephalic.     Right Ear: Tympanic membrane, ear canal and external ear normal.     Left Ear: Tympanic membrane, ear canal and external ear normal.     Nose: Nose normal.     Mouth/Throat:     Mouth: Mucous membranes are moist.  Eyes:     General: No scleral icterus.       Right eye: No discharge.        Left eye: No discharge.     Conjunctiva/sclera: Conjunctivae normal.  Neck:     Musculoskeletal: Neck supple. No neck rigidity.  Cardiovascular:     Rate and Rhythm: Normal rate and regular rhythm.     Heart sounds: No murmur.  Pulmonary:     Effort: Pulmonary effort is normal.     Breath sounds: Normal breath sounds.  Abdominal:     General: Bowel sounds are normal. There is no distension.     Palpations: Abdomen is soft. There is no mass.     Tenderness: There is mild lower abdominal tenderness. There is no guarding or rebound.     Hernia: No hernia is present.  Musculoskeletal: Normal range of motion.  Lymphadenopathy:     Cervical: No cervical adenopathy.  Skin:    General: Skin is warm and dry.     Coloration: Skin is not jaundiced.     Findings: No rash.  Neurological:     Mental Status: She is alert and oriented to person, place, and time.     Gait: Gait normal.  Psychiatric:        Mood and Affect: Mood normal.        Behavior: Behavior normal.        Thought Content: Thought content normal.        Judgment: Judgment normal.    UC Treatments / Results  Labs (all labs ordered are listed, but only abnormal results are displayed) Labs Reviewed  RESP PANEL BY RT-PCR (FLU A&B, COVID) [id number]  Respiratory panel is negative  EKG   Radiology No results found.  Procedures Procedures (including critical care time)  Medications Ordered in UC Medications - No data to display  Initial Impression / Assessment and Plan / UC Course  I have reviewed the triage vital signs and the nursing notes.  Pertinent labs  results  that were  available during my care of the patient were reviewed by me and considered in my medical decision making (see chart for details).  Diarrhea Viral illness  Placed on Phenergan for nausea as noted See instructions   Final Clinical Impressions(s) / UC Diagnoses   Final diagnoses:  Diarrhea, unspecified type  Flu-like symptoms     Discharge Instructions      Take pepto to help with the diarrhea     ED Prescriptions     Medication Sig Dispense Auth. Provider   promethazine (PHENERGAN) 25 MG tablet Take 1 tablet (25 mg total) by mouth every 6 (six) hours as needed for nausea or vomiting. 10 tablet Rodriguez-Southworth, Sunday Spillers, [name]      PDMP not reviewed this encounter.   Shelby Mattocks, [name] 01/22/22 1030

## 2022-03-28 ENCOUNTER — Other Ambulatory Visit: Payer: Self-pay | Admitting: Family Medicine

## 2022-03-28 DIAGNOSIS — Z1231 Encounter for screening mammogram for malignant neoplasm of breast: Secondary | ICD-10-CM

## 2022-08-06 ENCOUNTER — Telehealth: Payer: Commercial Managed Care - PPO | Admitting: Physician Assistant

## 2022-08-06 DIAGNOSIS — R6889 Other general symptoms and signs: Secondary | ICD-10-CM | POA: Diagnosis not present

## 2022-08-06 MED ORDER — BENZONATATE 100 MG PO CAPS: 100.0000 mg | ORAL_CAPSULE | Freq: Three times a day (TID) | ORAL | 0 refills | Status: DC | PRN

## 2022-08-06 MED ORDER — LIDOCAINE VISCOUS HCL 2 % MT SOLN: OROMUCOSAL | 0 refills | Status: DC

## 2022-08-06 MED ORDER — FLUTICASONE PROPIONATE 50 MCG/ACT NA SUSP: 2.0000 | Freq: Every day | NASAL | 0 refills | Status: DC

## 2022-08-06 MED ORDER — PROMETHAZINE-DM 6.25-15 MG/5ML PO SYRP: 5.0000 mL | ORAL_SOLUTION | Freq: Four times a day (QID) | ORAL | 0 refills | Status: DC | PRN

## 2022-08-06 NOTE — Progress Notes (Signed)
E visit for Flu like symptoms   We are sorry that you are not feeling well.  Here is how we plan to help! Based on what you have shared with me it looks like you may have flu-like symptoms that should be watched but do not seem to indicate anti-viral treatment.  Influenza or "the flu" is   an infection caused by a respiratory virus. The flu virus is highly contagious and persons who did not receive their yearly flu vaccination may "catch" the flu from close contact.  We have anti-viral medications to treat the viruses that cause this infection. They are not a "cure" and only shorten the course of the infection. These prescriptions are most effective when they are given within the first 2 days of "flu" symptoms. Antiviral medication are indicated if you have a high risk of complications from the flu. You should  also consider an antiviral medication if you are in close contact with someone who is at risk. These medications can help patients avoid complications from the flu  but have side effects that you should know. Possible side effects from Tamiflu or oseltamivir include nausea, vomiting, diarrhea, dizziness, headaches, eye redness, sleep problems or other respiratory symptoms. You should not take Tamiflu if you have an allergy to oseltamivir or any to the ingredients in Tamiflu.  Based upon your symptoms and potential risk factors I recommend that you follow the flu symptoms recommendation that I have listed below.  This is an infection that is most likely caused by a virus. There are no specific treatments other than to help you with the symptoms until the infection runs its course.  We are sorry you are not feeling well.  Here is how we plan to help!  For nasal congestion, you may use an oral decongestants such as Mucinex D or if you have glaucoma or high blood pressure use plain Mucinex.  Saline nasal spray or nasal drops can help and can safely be used as often as needed for congestion.  For  your congestion, I have prescribed Fluticasone nasal spray one spray in each nostril twice a day  If you do not have a history of heart disease, hypertension, diabetes or thyroid disease, prostate/bladder issues or glaucoma, you may also use Sudafed to treat nasal congestion.  It is highly recommended that you consult with a pharmacist or your primary care physician to ensure this medication is safe for you to take.     If you have a cough, you may use cough suppressants such as Delsym and Robitussin.  If you have glaucoma or high blood pressure, you can also use Coricidin HBP.   For cough I have prescribed for you A prescription cough medication called Tessalon Perles 100 mg. You may take 1-2 capsules every 8 hours as needed for cough and Promethazine DM Take 69mL every 6 hours as needed for cough. Can be used with Tessalon perles.  If you have a sore or scratchy throat, use a saltwater gargle-  to  teaspoon of salt dissolved in a 4-ounce to 8-ounce glass of warm water.  Gargle the solution for approximately 15-30 seconds and then spit.  It is important not to swallow the solution.  You can also use throat lozenges/cough drops and Chloraseptic spray to help with throat pain or discomfort.  Warm or cold liquids can also be helpful in relieving throat pain. I have also prescribed Viscous lidocaine Use 41mL swallow every 4-6 hours as needed for sore throat.  For headache, pain or general discomfort, you can use Ibuprofen or Tylenol as directed.   Some authorities believe that zinc sprays or the use of Echinacea may shorten the course of your symptoms.   ANYONE WHO HAS FLU SYMPTOMS SHOULD: Stay home. The flu is highly contagious and going out or to work exposes others! Be sure to drink plenty of fluids. Water is fine as well as fruit juices, sodas and electrolyte beverages. You may want to stay away from caffeine or alcohol. If you are nauseated, try taking small sips of liquids. How do you know if you  are getting enough fluid? Your urine should be a pale yellow or almost colorless. Get rest. Taking a steamy shower or using a humidifier may help nasal congestion and ease sore throat pain. Using a saline nasal spray works much the same way. Cough drops, hard candies and sore throat lozenges may ease your cough. Line up a caregiver. Have someone check on you regularly.   GET HELP RIGHT AWAY IF: You cannot keep down liquids or your medications. You become short of breath Your fell like you are going to pass out or loose consciousness. Your symptoms persist after you have completed your treatment plan MAKE SURE YOU  Understand these instructions. Will watch your condition. Will get help right away if you are not doing well or get worse.  Your e-visit answers were reviewed by a board certified advanced clinical practitioner to complete your personal care plan.  Depending on the condition, your plan could have included both over the counter or prescription medications.  If there is a problem please reply  once you have received a response from your provider.  Your safety is important to [place].  If you have drug allergies check your prescription carefully.    You can use MyChart to ask questions about today's visit, request a non-urgent call back, or ask for a work or school excuse for 24 hours related to this e-Visit. If it has been greater than 24 hours you will need to follow up with your provider, or enter a new e-Visit to address those concerns.  You will get an e-mail in the next two days asking about your experience.  I hope that your e-visit has been valuable and will speed your recovery. Thank you for using e-visits.  I have spent 5 minutes in review of e-visit questionnaire, review and updating patient chart, medical decision making and response to patient.   Mar Daring, [name]

## 2022-10-01 IMAGING — CR DG CHEST 1V
1 series · 1 of 1 positions shown · non-contrast
Comparison: No priors.

CLINICAL DATA: 44-year-old female with history of positive PPD.
Currently asymptomatic.

EXAM:
CHEST  1 VIEW

[chest pa]
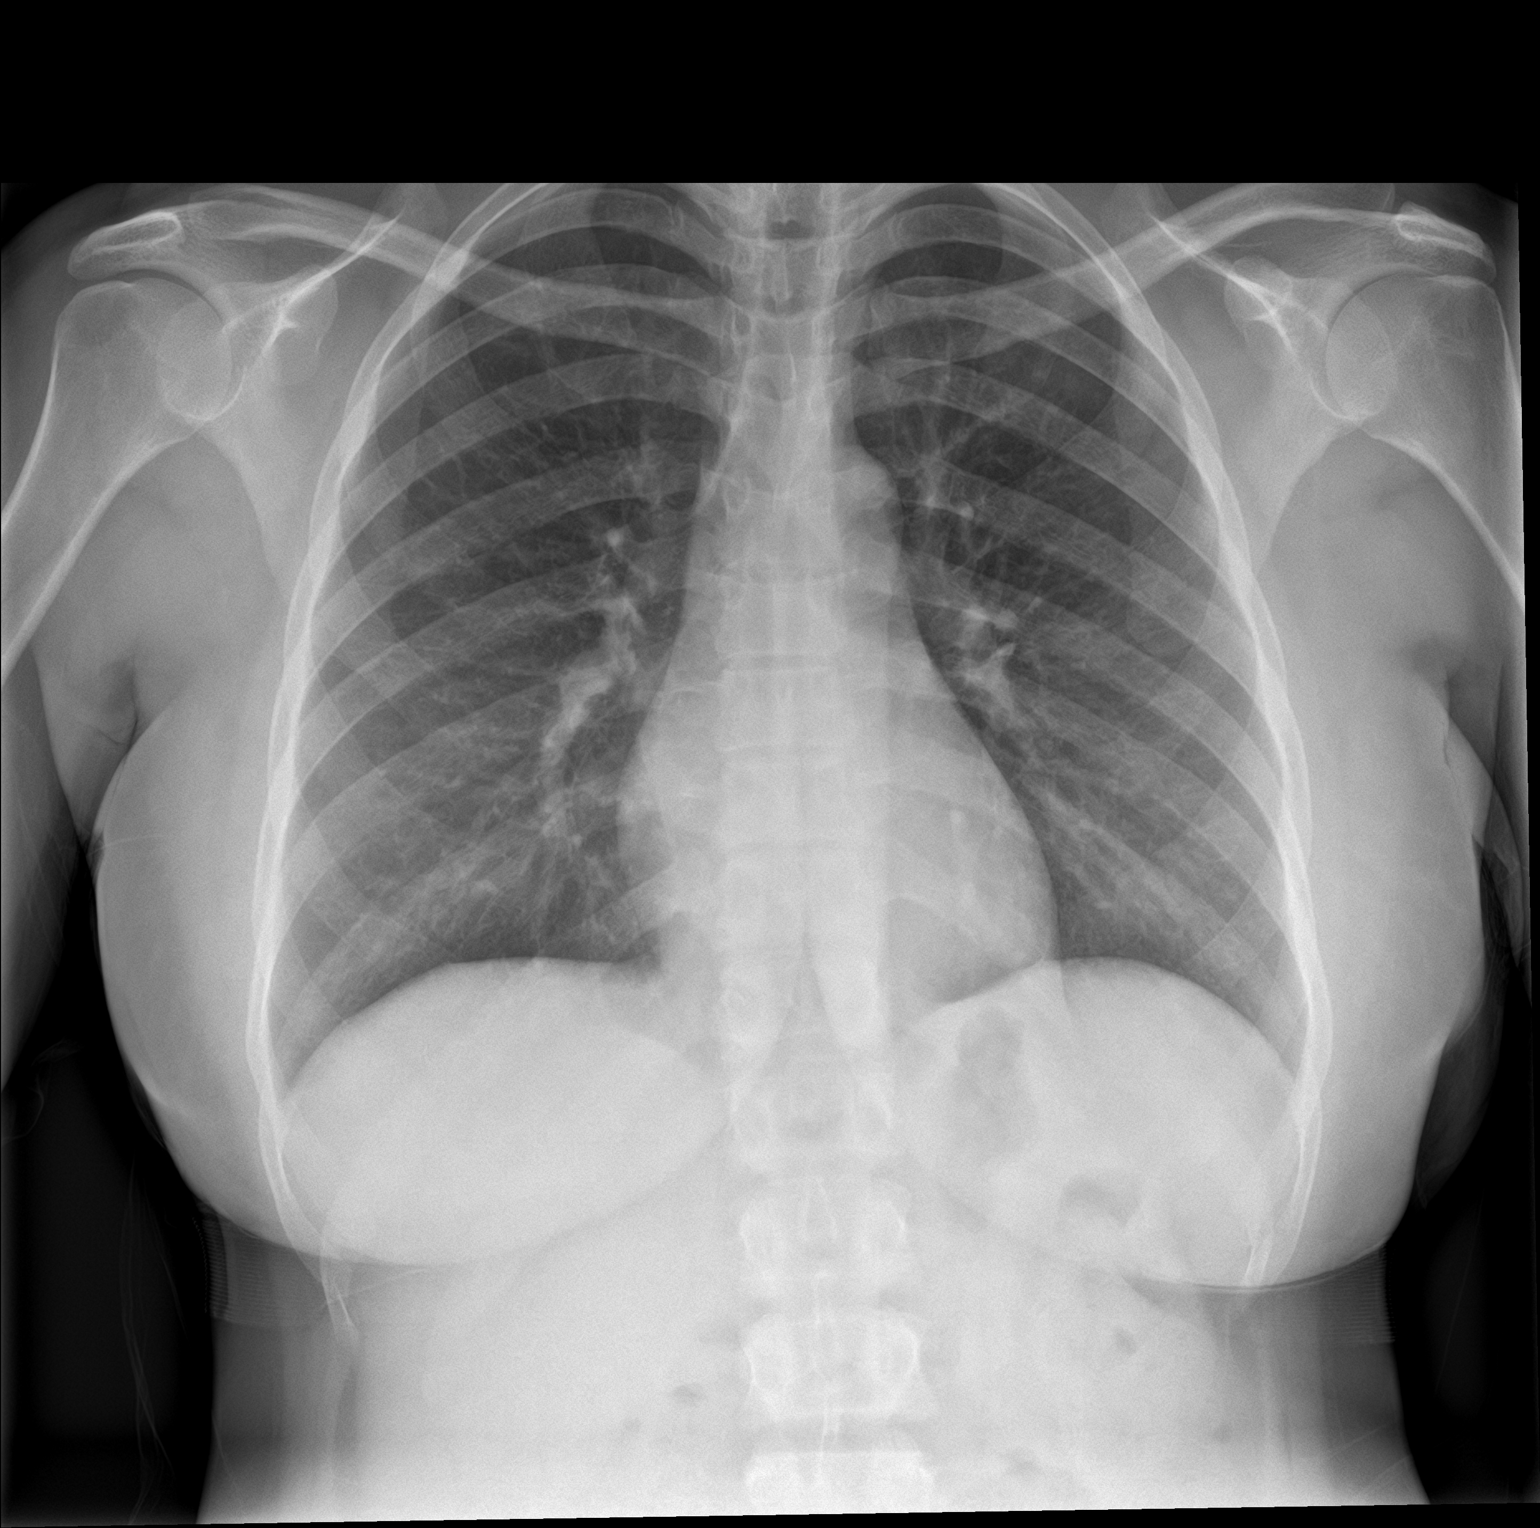

[1 of 1 positions shown; findings below may reference images not displayed]

FINDINGS: Lung volumes are normal. No consolidative airspace disease. No
pleural effusions. No pneumothorax. No pulmonary nodule or mass
noted. Pulmonary vasculature and the cardiomediastinal silhouette
are within normal limits.
IMPRESSION: No radiographic evidence to suggest active intrathoracic
tuberculosis.

## 2022-10-15 ENCOUNTER — Telehealth: Payer: Self-pay | Admitting: Gastroenterology

## 2022-10-15 NOTE — Telephone Encounter (Signed)
There is no previous procedure or referral for colonoscopy for this patient. Possible fax referral?

## 2022-10-15 NOTE — Telephone Encounter (Signed)
Pt left message to schedule colonoscopy  Call [phone]

## 2022-12-02 ENCOUNTER — Emergency Department: Payer: Commercial Managed Care - PPO

## 2022-12-02 ENCOUNTER — Inpatient Hospital Stay: Payer: Commercial Managed Care - PPO

## 2022-12-02 ENCOUNTER — Emergency Department
Admission: EM | Admit: 2022-12-02 | Discharge: 2022-12-02 | Disposition: A | Discharge status: Home / Self Care | Payer: Commercial Managed Care - PPO | Attending: Emergency Medicine | Admitting: Emergency Medicine

## 2022-12-02 ENCOUNTER — Other Ambulatory Visit: Payer: Self-pay

## 2022-12-02 DIAGNOSIS — R519 Headache, unspecified: Secondary | ICD-10-CM | POA: Diagnosis not present

## 2022-12-02 DIAGNOSIS — R200 Anesthesia of skin: Secondary | ICD-10-CM | POA: Diagnosis present

## 2022-12-02 DIAGNOSIS — R0602 Shortness of breath: Secondary | ICD-10-CM | POA: Insufficient documentation

## 2022-12-02 DIAGNOSIS — M542 Cervicalgia: Secondary | ICD-10-CM | POA: Diagnosis not present

## 2022-12-02 DIAGNOSIS — R531 Weakness: Secondary | ICD-10-CM | POA: Diagnosis not present

## 2022-12-02 DIAGNOSIS — R0789 Other chest pain: Secondary | ICD-10-CM | POA: Diagnosis not present

## 2022-12-02 LAB — BASIC METABOLIC PANEL
Anion gap: 8 (ref 5–15)
BUN: 9 mg/dL (ref 6–20)
CO2: 18 mmol/L — ABNORMAL LOW (ref 22–32)
Calcium: 9.5 mg/dL (ref 8.9–10.3)
Chloride: 110 mmol/L (ref 98–111)
Creatinine, Ser: 0.67 mg/dL (ref 0.44–1.00)
GFR, Estimated: >60 mL/min (ref >60)
Glucose, Bld: 119 mg/dL — ABNORMAL HIGH (ref 70–99)
Potassium: 3.2 mmol/L — ABNORMAL LOW (ref 3.5–5.1)
Sodium: 136 mmol/L (ref 135–145)

## 2022-12-02 LAB — CBC
HCT: 40.5 % (ref 36.0–46.0)
Hemoglobin: 13.2 g/dL (ref 12.0–15.0)
MCH: 26.9 pg (ref 26.0–34.0)
MCHC: 32.6 g/dL (ref 30.0–36.0)
MCV: 82.7 fL (ref 80.0–100.0)
Platelets: 321 10*3/uL (ref 150–400)
RBC: 4.9 MIL/uL (ref 3.87–5.11)
RDW: 12.6 % (ref 11.5–15.5)
WBC: 8.4 10*3/uL (ref 4.0–10.5)
nRBC: 0 % (ref 0.0–0.2)

## 2022-12-02 LAB — URINALYSIS, ROUTINE W REFLEX MICROSCOPIC
Bilirubin Urine: NEGATIVE
Glucose, UA: NEGATIVE mg/dL
Hgb urine dipstick: NEGATIVE
Ketones, ur: NEGATIVE mg/dL
Leukocytes,Ua: NEGATIVE
Nitrite: NEGATIVE
Protein, ur: NEGATIVE mg/dL
Specific Gravity, Urine: >1.046 — ABNORMAL HIGH (ref 1.005–1.030)
pH: 6 (ref 5.0–8.0)

## 2022-12-02 LAB — CSF CELL COUNT WITH DIFFERENTIAL
Eosinophils, CSF: 0 %
Eosinophils, CSF: 0 %
Lymphs, CSF: 59 %
Lymphs, CSF: 71 %
Monocyte-Macrophage-Spinal Fluid: 26 %
Monocyte-Macrophage-Spinal Fluid: 41 %
RBC Count, CSF: 0 /mm3 (ref 0–3)
RBC Count, CSF: 0 /mm3 (ref 0–3)
Segmented Neutrophils-CSF: 0 %
Segmented Neutrophils-CSF: 3 %
Tube #: 1
Tube #: 4
WBC, CSF: 5 /mm3 (ref 0–5)
WBC, CSF: 8 /mm3 — ABNORMAL HIGH (ref 0–5)

## 2022-12-02 LAB — PROTEIN AND GLUCOSE, CSF
Glucose, CSF: 52 mg/dL (ref 40–70)
Total  Protein, CSF: 42 mg/dL (ref 15–45)

## 2022-12-02 LAB — TROPONIN I (HIGH SENSITIVITY)
Troponin I (High Sensitivity): 5 ng/L (ref <18)
Troponin I (High Sensitivity): 5 ng/L (ref <18)

## 2022-12-02 LAB — CSF CULTURE W GRAM STAIN: Gram Stain: NONE SEEN

## 2022-12-02 LAB — HCG, QUANTITATIVE, PREGNANCY: hCG, Beta Chain, Quant, S: <1 m[IU]/mL (ref <5)

## 2022-12-02 LAB — MAGNESIUM: Magnesium: 1.9 mg/dL (ref 1.7–2.4)

## 2022-12-02 LAB — CBG MONITORING, ED: Glucose-Capillary: 125 mg/dL — ABNORMAL HIGH (ref 70–99)

## 2022-12-02 MED ORDER — IOHEXOL 350 MG/ML SOLN
75.0000 mL | Freq: Once | INTRAVENOUS | Status: AC | PRN
  Administered 2022-12-02: 75 mL via INTRAVENOUS

## 2022-12-02 MED ORDER — GADOBUTROL 1 MMOL/ML IV SOLN
7.0000 mL | Freq: Once | INTRAVENOUS | Status: AC | PRN
  Administered 2022-12-02: 7.5 mL via INTRAVENOUS

## 2022-12-02 MED ORDER — POTASSIUM CHLORIDE CRYS ER 20 MEQ PO TBCR
40.0000 meq | EXTENDED_RELEASE_TABLET | Freq: Once | ORAL | Status: AC
  Administered 2022-12-02: 40 meq via ORAL
  Filled 2022-12-02: qty 2

## 2022-12-02 MED ORDER — SODIUM CHLORIDE 0.9 % IV BOLUS (SEPSIS)
1000.0000 mL | Freq: Once | INTRAVENOUS | Status: AC
  Administered 2022-12-02: 1000 mL via INTRAVENOUS

## 2022-12-02 MED ORDER — KETOROLAC TROMETHAMINE 30 MG/ML IJ SOLN
30.0000 mg | Freq: Once | INTRAMUSCULAR | Status: AC
  Administered 2022-12-02: 30 mg via INTRAVENOUS
  Filled 2022-12-02: qty 1

## 2022-12-02 MED ORDER — LIDOCAINE HCL (PF) 1 % IJ SOLN
10.0000 mL | Freq: Once | INTRAMUSCULAR | Status: AC
  Administered 2022-12-02: 10 mL via INTRADERMAL
  Filled 2022-12-02: qty 10

## 2022-12-02 NOTE — ED Notes (Signed)
Patient to MRI.

## 2022-12-02 NOTE — ED Notes (Signed)
Lab called to add on hCG  

## 2022-12-02 NOTE — ED Notes (Signed)
Paper consent for lumbar puncture signed by patient, this RN, and Larinda Buttery, MD

## 2022-12-02 NOTE — ED Notes (Signed)
Time out completed at this time by Larinda Buttery, MD.

## 2022-12-02 NOTE — ED Notes (Signed)
Neuro, MD at bedside. 

## 2022-12-02 NOTE — ED Notes (Signed)
FIRST NURSE NOTE: Pt to ED via [facility] c/o weakness, anxiety, and left left that has been going on for a few weeks. No other complaints at this time.  87 HR 100% RA 150/84

## 2022-12-02 NOTE — ED Provider Notes (Signed)
-----------------------------------------   5:20 PM on 12/02/2022 -----------------------------------------  The hospitalist Dr. Alvester Morin had been consulted by Dr. Larinda Buttery for admission.  However on further discussion between myself, Dr. Alvester Morin, and Dr. Iver Nestle from neurology, it was determined that once the thoracic and lumbar MRIs were completed and the CSF was resulted, if these were reassuring, that the patient did not require inpatient admission as her workup would be complete.  I also reviewed the consult note from Dr. Adriana Simas who had seen the patient earlier and recommended outpatient follow-up if the additional imaging did not show any acute findings.  Dr. Alvester Morin did evaluate the patient in consult.  The MRI thoracic and lumbar spine are negative for acute findings that require intervention.  CSF shows minimal WBCs and no concerning findings.  I discussed this with Dr. Iver Nestle who confirms that the patient is appropriate for outpatient follow-up.  On reassessment, the patient appears well and states she is comfortable.  I counseled her on the results of the additional workup and the neurology and neurosurgery recommendations including the follow-up plan.  She feels well to go home and is in agreement with the plan.  I gave her return precautions and she expressed understanding.   Dionne Bucy, MD 12/02/22 1723

## 2022-12-02 NOTE — ED Triage Notes (Signed)
Pt states that she has had generalized weakness x 2 weeks with intermittent lightheadedness Pt also reports left lower leg pain x 1 week without specific injury.

## 2022-12-02 NOTE — Consult Note (Addendum)
Initial Consultation Note   Patient: Amanda Sparks ZOX:[id number] DOB: 07/07/1976 PCP: Armando Gang, FNP DOA: 12/02/2022 DOS: the patient was seen and examined on 12/02/2022 Primary service: Dionne Bucy, MD  Referring physician: Larinda Buttery  Reason for consult: R sided weakness   Assessment/Plan: Assessment and Plan: * Weakness Case discussed with on-call neurologist Dr. Iver Nestle.  Working diagnosis of spinal compressive disease.  MRI brain negative for any acute CVA.  Pending CSF studies as well as MR imaging of the lumbar and thoracic spine.  Per Dr. Iver Nestle, recommendations are for outpatient follow-up if CSF studies and MRI imaging is otherwise stable with neurology.  Will otherwise defer on admission pending CSF studies and imaging for now.       TRH will sign off at present, please call us again when needed.  HPI: Amanda Sparks is a 47 y.o. female with past medical history of anemia, GERD, palpitations presenting with left-sided weakness.  Patient reports progressive left-sided weakness over the past 2 weeks.  No fevers or chills.  No nausea or vomiting.  No reported recent illnesses.  Denies any recent trauma.  Has some left-sided neck pain as well.  No vision changes.  Minimal headache.  Has had difficulty with ambulation secondary to weakness.  Minimal paresthesias.  No reported bowel or bladder incontinence.  No abdominal pain or diarrhea.  Denies any prior symptoms like this in the past.  Positive mild headaches.  No new reported medications. Presented to the ER afebrile, hemodynamically stable.  White count 8.4, hemoglobin 13.2, urinalysis within normal limits, EKG troponin stable.  Chest x-ray, CT angio head and neck as well as MRI of the brain otherwise within normal limits.  MRI of the C-spine obtained showing C4-C5 severe canal stenosis with cord compression of the cord.  Some question of demyelinating lesion at C4-C5 level.  Multilevel bilateral foraminal  stenosis.  Neurology and neurosurgery consulted.  Recommendations for is for lumbar puncture as well as MRIs of the T and L-spine.  Working diagnosis of spinal compressive disease.  Recommendation for outpatient follow-up follow-up if CSF studies and imaging are reassuring. Review of Systems: As mentioned in the history of present illness. All other systems reviewed and are negative. Past Medical History:  Diagnosis Date   Anemia    Family history of adverse reaction to anesthesia    1st cousin-never woke back up after surgery   GERD (gastroesophageal reflux disease)    h/o   History of palpitations    Past Surgical History:  Procedure Laterality Date   ABLATION     CARPAL TUNNEL RELEASE Right 09/30/2018   Procedure: CARPAL TUNNEL RELEASE;  Surgeon: Juanell Fairly, MD;  Location: [facility] ORS;  Service: Orthopedics;  Laterality: Right;   Social History:  reports that she has never smoked. She has never used smokeless tobacco. She reports that she does not drink alcohol and does not use drugs.  Allergies  Allergen Reactions   Bactrim [Sulfamethoxazole-Trimethoprim] Rash    Family History  Problem Relation Age of Onset   Hypertension Mother    Diabetes Father    Breast cancer Maternal Grandmother     Prior to Admission medications   Medication Sig Start Date End Date Taking? Authorizing Provider  benzonatate (TESSALON) 100 MG capsule Take 1 capsule (100 mg total) by mouth 3 (three) times daily as needed. 08/06/22   Margaretann Loveless, [name]  Cholecalciferol (VITAMIN D3) 1.25 MG (50000 UT) CAPS Take 1 tablet by mouth once a week. X  4 weeks    [provider]  fluticasone (FLONASE) 50 MCG/ACT nasal spray Place 2 sprays into both nostrils daily. 08/06/22   Margaretann Loveless, [name]  lidocaine (XYLOCAINE) 2 % solution Swallow 5mL every 4-6 hours as needed for sore throat 08/06/22   Margaretann Loveless, [name]  phenazopyridine (PYRIDIUM) 200 MG tablet Take 1 tablet (200 mg total)  by mouth 3 (three) times daily. 07/10/21   Rodriguez-Southworth, Nettie Elm, [name]  promethazine (PHENERGAN) 25 MG tablet Take 1 tablet (25 mg total) by mouth every 6 (six) hours as needed for nausea or vomiting. 01/22/22   Rodriguez-Southworth, Nettie Elm, [name]  promethazine-dextromethorphan (PROMETHAZINE-DM) 6.25-15 MG/5ML syrup Take 5 mLs by mouth 4 (four) times daily as needed. 08/06/22   Margaretann Loveless, [name]    Physical Exam: Vitals:   12/02/22 1156 12/02/22 1207 12/02/22 1300 12/02/22 1616  BP:  (!) 104/92 115/67   Pulse:  75 74   Resp:  16 15   Temp: 98.2 F (36.8 C)   98.2 F (36.8 C)  TempSrc: Oral   Oral  SpO2:  100% 100%   Weight:      Height:       Physical Exam Constitutional:      Appearance: She is normal weight.  HENT:     Head: Normocephalic and atraumatic.     Nose: Nose normal.  Eyes:     Pupils: Pupils are equal, round, and reactive to light.  Cardiovascular:     Rate and Rhythm: Normal rate and regular rhythm.  Pulmonary:     Effort: Pulmonary effort is normal.  Abdominal:     General: Bowel sounds are normal.  Musculoskeletal:     Comments: Minimal to mild left-sided weakness in upper or lower extremities  Neurological:     General: No focal deficit present.  Psychiatric:        Mood and Affect: Mood normal.     Data Reviewed:   There are no new results to review at this time.   MR Cervical Spine W and Wo Contrast CLINICAL DATA:  Multiple sclerosis  EXAM: MRI CERVICAL SPINE WITHOUT AND WITH CONTRAST  TECHNIQUE: Multiplanar and multiecho pulse sequences of the cervical spine, to include the craniocervical junction and cervicothoracic junction, were obtained without and with intravenous contrast.  CONTRAST:  7.49mL GADAVIST GADOBUTROL 1 MMOL/ML IV SOLN  COMPARISON:  CT 08/28/2018  FINDINGS: Alignment: Straightening of the cervical lordosis. No significant listhesis.  Vertebrae: No fracture, evidence of discitis, or bone lesion.  Mild diffuse intrinsic canal narrowing on the basis of congenitally short pedicles.  Cord: Small focus of T2/STIR hyperintense signal within the right hemicord at the C4-5 level (series 3, image 8). There is postcontrast enhancement at this area following the administration of gadolinium contrast (series 7, image 8). No additional sites of focal cord signal abnormality are identified within the cervical spine.  Posterior Fossa, vertebral arteries, paraspinal tissues: Negative.  Disc levels:  C2-C3: No focal disc protrusion. Minimal uncovertebral spurring. No significant foraminal or canal stenosis.  C3-C4: Mild disc osteophyte complex and minimal uncovertebral spurring. Mild-moderate canal stenosis. Mild bilateral foraminal stenosis, right greater than left.  C4-C5: Disc osteophyte complex, eccentric to the right with right greater than left uncovertebral spurring. Severe canal stenosis with compression of the cord, more pronounced on the right. Moderate right and mild left foraminal stenosis.  C5-C6: Disc osteophyte complex, eccentric to the right. Right greater than left uncovertebral spurring. Severe canal stenosis with compression of the cord,  more pronounced on the right. Mild-moderate bilateral foraminal stenosis.  C6-C7: No focal disc protrusion. Mild left-sided uncovertebral spurring contributing to mild left foraminal stenosis. No significant canal stenosis.  C7-T1: Unremarkable.  IMPRESSION: 1. 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 Findings are most pronounced at the C4-5 and C5-6 levels where there is severe canal stenosis with compression of the cord. 2. Small focus of T2/STIR hyperintense signal within the right hemicord at the C4-5 level. There is postcontrast enhancement at this area following the administration of gadolinium contrast. Although acute compressive myelomalacia could have this appearance, findings are concerning  for an active demyelinating lesion given the patient's history. 3. No additional sites of focal cord signal abnormality are identified within the cervical spine. 4. Multilevel bilateral foraminal stenosis, most pronounced at the C4-5 level.  Electronically Signed   By: Duanne Guess D.O.   On: 12/02/2022 10:32 MR Brain W and Wo Contrast CLINICAL DATA:  Anemia with left-sided weakness and numbness for 2 weeks.  EXAM: MRI HEAD WITHOUT AND WITH CONTRAST  TECHNIQUE: Multiplanar, multiecho pulse sequences of the brain and surrounding structures were obtained without and with intravenous contrast.  CONTRAST:  7.45mL GADAVIST GADOBUTROL 1 MMOL/ML IV SOLN  COMPARISON:  None Available.  FINDINGS: Brain: No acute infarction, hemorrhage, hydrocephalus, extra-axial collection or mass lesion. Few remote white matter insults, numbering less than 10, which can be seen with prior ischemia, trauma, migraine, or inflammation. No typical demyelinating pattern.  Vascular: Normal flow voids.  Skull and upper cervical spine: Normal marrow signal.  Sinuses/Orbits: Negative.  IMPRESSION: 1. No acute finding or specific cause for symptoms. 2. Few remote white matter insults with nonspecific pattern. No typical demyelinating changes.  Electronically Signed   By: Tiburcio Pea M.D.   On: 12/02/2022 10:22 CT ANGIO HEAD NECK W WO CM CLINICAL DATA:  Generalized weakness for 2 weeks  EXAM: CT ANGIOGRAPHY HEAD AND NECK WITH AND WITHOUT CONTRAST  TECHNIQUE: Multidetector CT imaging of the head and neck was performed using the standard protocol during bolus administration of intravenous contrast. Multiplanar CT image reconstructions and MIPs were obtained to evaluate the vascular anatomy. Carotid stenosis measurements (when applicable) are obtained utilizing NASCET criteria, using the distal internal carotid diameter as the denominator.  RADIATION DOSE REDUCTION: This exam was performed  according to the departmental dose-optimization program which includes automated exposure control, adjustment of the mA and/or kV according to patient size and/or use of iterative reconstruction technique.  CONTRAST:  75mL OMNIPAQUE IOHEXOL 350 MG/ML SOLN  COMPARISON:  None Available.  FINDINGS: CT HEAD FINDINGS  Brain: No evidence of acute infarction, hemorrhage, hydrocephalus, extra-axial collection or mass lesion/mass effect.  Vascular: No hyperdense vessel or unexpected calcification.  Skull: Normal. Negative for fracture or focal lesion.  Sinuses/Orbits: No acute finding.  Review of the MIP images confirms the above findings  CTA NECK FINDINGS  Aortic arch: Negative.  Right carotid system: Mild undulation of the right ICA without beading or ulceration, likely mild atheromatous plaque. No flow limiting stenosis  Left carotid system: Low-density plaque on the posterior wall of the ICA bulb, see sagittal MIPS. No significant stenosis and no ulceration or beading  Vertebral arteries: No proximal subclavian stenosis. Codominant vertebral arteries that are smoothly contoured and widely patent to the dura.  Skeleton: C4-5 and C5-6 disc narrowing with central protrusions which could contact the ventral cord.  Other neck: No acute finding  Upper chest: No acute finding  Review of the MIP images confirms the  above findings  CTA HEAD FINDINGS  Anterior circulation: Vessels are smoothly contoured and widely patent. No branch occlusion, beading, or aneurysm  Posterior circulation: The vertebral and basilar arteries are smoothly contoured and widely patent. No branch occlusion, beading, or aneurysm  Venous sinuses: None significant  Anatomic variants: None significant  Review of the MIP images confirms the above findings  IMPRESSION: 1. No emergent finding or significant stenosis. 2. Mild cervical carotid atherosclerosis.  Electronically Signed   By:  Tiburcio Pea M.D.   On: 12/02/2022 08:37 DG Chest Portable 1 View CLINICAL DATA:  Chest pain and shortness of breath.  EXAM: PORTABLE CHEST 1 VIEW  COMPARISON:  02/06/2021  FINDINGS: The heart size and mediastinal contours are within normal limits. Both lungs are clear. The visualized skeletal structures are unremarkable.  IMPRESSION: No active disease.  Electronically Signed   By: Signa Kell M.D.   On: 12/02/2022 06:06  Lab Results  Component Value Date   WBC 8.4 12/02/2022   HGB 13.2 12/02/2022   HCT 40.5 12/02/2022   MCV 82.7 12/02/2022   PLT 321 12/02/2022   Last metabolic panel Lab Results  Component Value Date   GLUCOSE 119 (H) 12/02/2022   NA 136 12/02/2022   K 3.2 (L) 12/02/2022   CL 110 12/02/2022   CO2 18 (L) 12/02/2022   BUN 9 12/02/2022   CREATININE 0.67 12/02/2022   GFRNONAA >60 12/02/2022   CALCIUM 9.5 12/02/2022   PROT 7.6 10/30/2021   ALBUMIN 4.0 10/30/2021   BILITOT 0.6 10/30/2021   ALKPHOS 37 (L) 10/30/2021   AST 16 10/30/2021   ALT 15 10/30/2021   ANIONGAP 8 12/02/2022    Family Communication: No family at the bedside  Primary team communication: Dr. Marisa Severin was informed of plan for outpt follow up pending LP and MRI &L spine per neurology recommendations.  Thank you very much for involving [place] in the care of your patient.   Greater than 50% was spent in counseling and coordination of care with patient Total encounter time 80 minutes or more  Author: Floydene Flock, MD 12/02/2022 4:18 PM  For on call review [email or web address].

## 2022-12-02 NOTE — Consult Note (Signed)
Neurosurgery-New Consultation Evaluation 12/02/2022 Amanda Sparks [id number]  Identifying Statement: Amanda Sparks is a 47 y.o. female from [place] [id number] with weakness and numbness  Physician Requesting Consultation: Dr Larinda Buttery, ED  History of Present Illness: Amanda Sparks is here for evaluation of a variety of symptoms including pain and numbness in the left leg that started two weeks ago. She reports some pain intermittently over years given prior injury in that leg but this pain is different behind left knee. The numbness extends up whole leg. She is also having weakness and numbness in the left arm entirely but denies any right symptoms. She is right handed. She denies any injury or changes prior to these symptoms onset. She denies any truncal numbness or groin numbness. She had a MRI of brain and cervical spine and is here for review.   Past Medical History:  Past Medical History:  Diagnosis Date   Anemia    Family history of adverse reaction to anesthesia    1st cousin-never woke back up after surgery   GERD (gastroesophageal reflux disease)    h/o   History of palpitations     Social History: Social History   Socioeconomic History   Marital status: Married    Spouse name: Not on file   Number of children: Not on file   Years of education: Not on file   Highest education level: Not on file  Occupational History   Not on file  Tobacco Use   Smoking status: Never   Smokeless tobacco: Never  Vaping Use   Vaping Use: Never used  Substance and Sexual Activity   Alcohol use: No    Alcohol/week: 0.0 standard drinks of alcohol   Drug use: No   Sexual activity: Yes    Birth control/protection: Surgical    Comment: BTL 2008  Other Topics Concern   Not on file  Social History Narrative   Not on file   Social Determinants of Health   Financial Resource Strain: Not on file  Food Insecurity: Not on file  Transportation Needs: Not on file  Physical  Activity: Not on file  Stress: Not on file  Social Connections: Not on file  Intimate Partner Violence: Not on file    Family History: Family History  Problem Relation Age of Onset   Hypertension Mother    Diabetes Father    Breast cancer Maternal Grandmother     Review of Systems:  Review of Systems - General ROS: Negative Psychological ROS: Negative Ophthalmic ROS: Negative ENT ROS: Negative Hematological and Lymphatic ROS: Negative  Endocrine ROS: Negative Respiratory ROS: Negative Cardiovascular ROS: Negative Gastrointestinal ROS: Negative Genito-Urinary ROS: Negative Musculoskeletal ROS: Negative Neurological ROS: Positive for pain, weakness, numbness Dermatological ROS: Negative  Physical Exam: BP 115/67   Pulse 74   Temp 98.2 F (36.8 C) (Oral)   Resp 15   Ht 5\' 4"  (1.626 m)   Wt 70.8 kg   LMP 11/04/2022 (Exact Date)   SpO2 100%   BMI 26.78 kg/m  Body mass index is 26.78 kg/m. Body surface area is 1.79 meters squared. General appearance: Alert, cooperative, in no acute distress Head: Normocephalic, atraumatic Eyes: Normal, EOM intact Oropharynx: Moist without lesions Neck: Supple, ROM is full Ext: No edema in extremities  Neurologic exam:  Mental status: alertness: alert, affect: normal Speech: fluent and clear Motor:strength symmetric 5/5 in right arm and leg throughout. On left she is 4+/5 in grip and IO but appears 5/5 in bicep and triceps,  In left leg, she is 5/5 in hip flexion, dorsiflexion, and plantarflexion Sensory: decreased to light touch throughout entire left arm and leg but intact over thorax and abdomen Reflexes: 2+ and symmetric bilaterally for patella, negative hoffman's, no clonus at ankle Gait: not tested   Laboratory: Results for orders placed or performed during the hospital encounter of 12/02/22  Basic metabolic panel  Result Value Ref Range   Sodium 136 135 - 145 mmol/L   Potassium 3.2 (L) 3.5 - 5.1 mmol/L   Chloride 110 98  - 111 mmol/L   CO2 18 (L) 22 - 32 mmol/L   Glucose, Bld 119 (H) 70 - 99 mg/dL   BUN 9 6 - 20 mg/dL   Creatinine, Ser 4.09 0.44 - 1.00 mg/dL   Calcium 9.5 8.9 - 81.1 mg/dL   GFR, Estimated >91 >47 mL/min   Anion gap 8 5 - 15  CBC  Result Value Ref Range   WBC 8.4 4.0 - 10.5 K/uL   RBC 4.90 3.87 - 5.11 MIL/uL   Hemoglobin 13.2 12.0 - 15.0 g/dL   HCT 82.9 56.2 - 13.0 %   MCV 82.7 80.0 - 100.0 fL   MCH 26.9 26.0 - 34.0 pg   MCHC 32.6 30.0 - 36.0 g/dL   RDW 86.5 78.4 - 69.6 %   Platelets 321 150 - 400 K/uL   nRBC 0.0 0.0 - 0.2 %  hCG, quantitative, pregnancy  Result Value Ref Range   hCG, Beta Chain, Quant, S <1 <5 mIU/mL  Magnesium  Result Value Ref Range   Magnesium 1.9 1.7 - 2.4 mg/dL  Urinalysis, Routine w reflex microscopic -Urine, Clean Catch  Result Value Ref Range   Color, Urine YELLOW (A) YELLOW   APPearance HAZY (A) CLEAR   Specific Gravity, Urine >1.046 (H) 1.005 - 1.030   pH 6.0 5.0 - 8.0   Glucose, UA NEGATIVE NEGATIVE mg/dL   Hgb urine dipstick NEGATIVE NEGATIVE   Bilirubin Urine NEGATIVE NEGATIVE   Ketones, ur NEGATIVE NEGATIVE mg/dL   Protein, ur NEGATIVE NEGATIVE mg/dL   Nitrite NEGATIVE NEGATIVE   Leukocytes,Ua NEGATIVE NEGATIVE  CBG monitoring, ED  Result Value Ref Range   Glucose-Capillary 125 (H) 70 - 99 mg/dL  Troponin I (High Sensitivity)  Result Value Ref Range   Troponin I (High Sensitivity) 5 <18 ng/L  Troponin I (High Sensitivity)  Result Value Ref Range   Troponin I (High Sensitivity) 5 <18 ng/L   I personally reviewed labs  Imaging: Results for orders placed during the hospital encounter of 12/02/22  MR Brain W and Wo Contrast  Narrative CLINICAL DATA:  Anemia with left-sided weakness and numbness for 2 weeks.  EXAM: MRI HEAD WITHOUT AND WITH CONTRAST  TECHNIQUE: Multiplanar, multiecho pulse sequences of the brain and surrounding structures were obtained without and with intravenous contrast.  CONTRAST:  7.55mL GADAVIST  GADOBUTROL 1 MMOL/ML IV SOLN  COMPARISON:  None Available.  FINDINGS: Brain: No acute infarction, hemorrhage, hydrocephalus, extra-axial collection or mass lesion. Few remote white matter insults, numbering less than 10, which can be seen with prior ischemia, trauma, migraine, or inflammation. No typical demyelinating pattern.  Vascular: Normal flow voids.  Skull and upper cervical spine: Normal marrow signal.  Sinuses/Orbits: Negative.  IMPRESSION: 1. No acute finding or specific cause for symptoms.  MRI Cervical Spine:  1. 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 Findings are most pronounced at the C4-5 and C5-6 levels where there is severe canal stenosis with compression of the  cord. 2. Small focus of T2/STIR hyperintense signal within the right hemicord at the C4-5 level. There is postcontrast enhancement at this area following the administration of gadolinium contrast. Although acute compressive myelomalacia could have this appearance, findings are concerning for an active demyelinating lesion given the patient's history. 3. No additional sites of focal cord signal abnormality are identified within the cervical spine. 4. Multilevel bilateral foraminal stenosis, most pronounced at the C4-5 level.   I personally reviewed radiology studies:   Impression/Plan:  Amanda Sparks is here for evaluation of weakness and numbness which doesn't fit completely with myelopathy given exam findings. She is also having pain in leg. Neurology is consulted and is performing LP. I do think we need MRI thoracic spine. I do not see a need for urgent spine surgery as the compressive findings are also present on CT in 2020. She may ultimately need elective surgery and we will set her up for outpatient follow up.    1.  Diagnosis: Weakness and numbness  2.  Plan - MRI thoracic spine Neurology evaluation Follow up as outpatient

## 2022-12-02 NOTE — Assessment & Plan Note (Addendum)
Case discussed with on-call neurologist Dr. Iver Nestle.  Working diagnosis of spinal compressive disease.  MRI brain negative for any acute CVA.  Pending CSF studies as well as MR imaging of the lumbar and thoracic spine.  Per Dr. Iver Nestle, recommendations are for outpatient follow-up if CSF studies and MRI imaging is otherwise stable with neurology.  Will otherwise defer on admission pending CSF studies and imaging for now.

## 2022-12-02 NOTE — ED Notes (Signed)
Patient to CT at this time

## 2022-12-02 NOTE — Discharge Instructions (Addendum)
We have given you referrals to see the neurologist (brain specialist) and the neurosurgeon (spine specialist) for follow-up.  Return to the ER for new, worsening, or persistent severe weakness or numbness, or any other new or worsening symptoms that concern you.

## 2022-12-02 NOTE — Consult Note (Signed)
Neurology Consultation Reason for Consult: MRI findings concerning for MS; left sided tingling > weakness  Requesting Physician: Chesley Noon   CC: Left-sided weakness/numbness  History is obtained from: Patient, chart review  HPI: Amanda Sparks is a 47 y.o. female with a past medical history significant for anemia, anxiety on duloxetine and clonazepam, chronic neck pain, carpal tunnel syndrome bilaterally s/p release on the right  She reports she had an anxiety attack yesterday and has not felt quite herself since then, and has been worried about a blood clot in her left leg due to continued intermittent left leg symptoms over the past few weeks.  She notes that she had left greater than right lower extremity numbness 2 weeks ago at the end of a workday which she attributed to being on her feet a lot and thought she may need compression stockings.  Symptoms resolved with rest overnight but the next day again towards the end of her shift she began to have increasing symptoms.  The daily pattern has been improvement with rest and worsening with activity, overall with gradual worsening over time, and gradual involvement of her left arm as well.  At times she has needed to use her hands to help get her leg in and out of car.  Denies any bowel or bladder symptoms, including any genitoanal numbness.  Denies any significant symptoms on her right side noting that her symptoms have been much more predominant on the left than the right.  She endorses Lhermitte sign (twice in the last couple of weeks) but denies Urthoff's phenomenon.  She denies any prior episodes of vision issues including color desaturation, loss of vision, blurry vision. Additionally she reports some intermittent shortness of breath for the past 2 weeks lasting seconds happening about twice a day typically with activity but once while sitting associated with racing heart rate. She does note she has a history of anxiety/panic attacks  with which she will feel lightheaded and dizzy Her menstrual cycles have been normal, she did have prior ablation due to her anemia but she does continue to have periods at this time and they have not changed in character  She does have frequent headaches 4-5 out of 7 days a week typically right greater than left-sided with some intermittent shooting pain up to 10 out of 10 in intensity but predominantly a squeezing sensation 8-9/10.  Her headaches are associated with some light sensitivity but no noise sensitivity, no nausea, no vomiting, typically lasting a few hours and improving with rest.  Not waking her out of sleep, not associated with fevers/chills  ROS: All other review of systems was negative except as noted in the HPI.   Past Medical History:  Diagnosis Date   Anemia    Family history of adverse reaction to anesthesia    1st cousin-never woke back up after surgery   GERD (gastroesophageal reflux disease)    h/o   History of palpitations    Past Surgical History:  Procedure Laterality Date   ABLATION     CARPAL TUNNEL RELEASE Right 09/30/2018   Procedure: CARPAL TUNNEL RELEASE;  Surgeon: Juanell Fairly, MD;  Location: [facility] ORS;  Service: Orthopedics;  Laterality: Right;   Current Outpatient Medications  Medication Instructions   benzonatate (TESSALON) 100 mg, Oral, 3 times daily PRN   Cholecalciferol (VITAMIN D3) 1.25 MG (50000 UT) CAPS 1 tablet, Oral, Weekly, X 4 weeks    fluticasone (FLONASE) 50 MCG/ACT nasal spray 2 sprays, Each Nare, Daily  lidocaine (XYLOCAINE) 2 % solution Swallow 26mL every 4-6 hours as needed for sore throat   phenazopyridine (PYRIDIUM) 200 mg, Oral, 3 times daily   promethazine (PHENERGAN) 25 mg, Oral, Every 6 hours PRN   promethazine-dextromethorphan (PROMETHAZINE-DM) 6.25-15 MG/5ML syrup 5 mLs, Oral, 4 times daily PRN    Family History  Problem Relation Age of Onset   Hypertension Mother    Diabetes Father    Breast cancer Maternal  Grandmother   Liver cancer and colon cancer history in the family No history of autoimmune diseases  Social History:  reports that she has never smoked. She has never used smokeless tobacco. She reports that she does not drink alcohol and does not use drugs.   Exam: Current vital signs: BP 120/63   Pulse 66   Temp 98.4 F (36.9 C) (Oral)   Resp 13   Ht 5\' 4"  (1.626 m)   Wt 70.8 kg   LMP 11/04/2022 (Exact Date)   SpO2 99%   BMI 26.78 kg/m  Vital signs in last 24 hours: Temp:  [98.4 F (36.9 C)-98.6 F (37 C)] 98.4 F (36.9 C) (04/14 0736) Pulse Rate:  [65-85] 66 (04/14 1025) Resp:  [13-24] 13 (04/14 1025) BP: (100-120)/(44-67) 120/63 (04/14 1025) SpO2:  [99 %-100 %] 99 % (04/14 1025) Weight:  [70.8 kg] 70.8 kg (04/14 0227)   Physical Exam  Constitutional: Appears well-developed and well-nourished.  Psych: Affect appropriate to situation, calm and cooperative Eyes: No scleral injection HENT: No oropharyngeal obstruction.  MSK: no joint deformities.  Cardiovascular: Normal rate and regular rhythm. Perfusing extremities well Respiratory: Effort normal, non-labored breathing GI: Soft.  No distension. There is no tenderness.  Skin: Warm dry and intact visible skin  Neuro: Mental Status: Patient is awake, alert, oriented to person, place, month, year, and situation. Patient is able to give a clear and coherent history. No signs of aphasia or neglect Cranial Nerves: II: Visual Fields are full. Pupils are equal, round, and reactive to light.  Slight hippus of the left pupil but no afferent pupillary defect III,IV, VI: EOMI without ptosis or diploplia.  V: Facial sensation is symmetric to light touch VII: Facial movement is symmetric.  VIII: hearing is intact to voice X: Uvula elevates symmetrically XI: Shoulder shrug is symmetric. XII: tongue is midline without atrophy or fasciculations.  Motor: Tone is normal. Bulk is normal. 5/5 strength was present in all four  extremities, except for mild left hand weakness extension greater than flexion which patient reports is chronic for many years.  However she subjectively feels that there is some weakness/tingling with movement of the left upper extremity in particular Sensory: Sensation is symmetric to light touch and temperature in the arms and legs.  She does note some tingling feeling throughout the arm, but no tingling in the leg at the time of my evaluation Deep Tendon Reflexes: 2+ throughout.  In the upper extremities easier to elicit on the left side compared to the right but present throughout.  Negative Hoffmann's in the lower extremities 2+ patellar and Achilles, downgoing toes, no clonus at the ankles Plantars: Toes are downgoing bilaterally.  Cerebellar: FNF and HKS are intact bilaterally Gait:  Negative Romberg, stable even with push/pull Able to rise on heels and toes Able to tandem gait, perhaps with the slightest favoring of the left leg on tandem only although patient reports she feels no difference in her legs at the time of my evaluation   I have reviewed labs in epic and  the results pertinent to this consultation are:  Basic Metabolic Panel: Recent Labs  Lab 12/02/22 0229 12/02/22 0615  NA 136  --   K 3.2*  --   CL 110  --   CO2 18*  --   GLUCOSE 119*  --   BUN 9  --   CREATININE 0.67  --   CALCIUM 9.5  --   MG  --  1.9    CBC: Recent Labs  Lab 12/02/22 0229  WBC 8.4  HGB 13.2  HCT 40.5  MCV 82.7  PLT 321    I have reviewed the images obtained:  MRI brain 1. No acute finding or specific cause for symptoms. 2. Few remote white matter insults with nonspecific pattern. No typical demyelinating changes.  MRI C-spine 1. 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 Findings are most pronounced at the C4-5 and C5-6 levels where there is severe canal stenosis with compression of the cord. 2. Small focus of T2/STIR hyperintense signal within the  right hemicord at the C4-5 level. There is postcontrast enhancement at this area following the administration of gadolinium contrast. Although acute compressive myelomalacia could have this appearance, findings are concerning for an active demyelinating lesion given the patient's history. 3. No additional sites of focal cord signal abnormality are identified within the cervical spine. 4. Multilevel bilateral foraminal stenosis, most pronounced at the C4-5 level.  Impression: History is provided by the patient is atypical for multiple sclerosis.  Notably she is not having any significant right-sided symptoms despite the imaging abnormality on the right side.  On review of records she has a longstanding history of C4-C5 degenerative disc disease seen as early as 2020 on x-ray.  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  Fortunately history as she provides is not typical either for MS or acute compressive myelopathy (with daily symptoms worsening with activity and improving with rest).  Perhaps represents a "double crush" symptom of neuropathy in combination with compressive disease.  In discussion with neurosurgery, to whom she was much more focused on left leg pain, will also obtain MRI thoracic and lumbar spine imaging, without contrast due to recent contrast load.  If this imaging and preliminary CSF studies are reassuring, outpatient follow-up with neurology is appropriate  Recommendations: -Lumbar puncture with cell counts in tubes 1 and 4, protein, glucose, oligoclonal bands -MRI thoracic spine and MRI lumbar spine -Patient prefers outpatient follow-up with [facility] neurology, should be given number to call for appointment with Dr. Sherryll Burger: [phone]  Brooke Dare MD-PhD [facility] [phone] [facility] coverage for [facility] is from 8 AM to 4 AM in-house and 4 PM to 8 PM by telephone/video. 8 PM to 8 AM emergent  questions or overnight urgent questions should be addressed to Teleneurology On-call or [facility] neurohospitalist; contact information can be found on AMION  Discussed with Dr. Adriana Simas at bedside and Dr. Larinda Buttery

## 2022-12-02 NOTE — ED Provider Notes (Signed)
[facility] Provider Note    Event Date/Time   First MD Initiated Contact with Patient 12/02/22 0448     (approximate)   History   Weakness   HPI  Amanda Sparks is a 47 y.o. female with history of anemia who presents to the emergency department with left-sided numbness and weakness ongoing for the past 2 weeks.  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  She states when symptoms worsened and she started having a hard time walking she became more concerned.  She is also had some headache and left-sided neck pain as well.  No midline neck pain, head injury, neck injury.  No bowel or bladder incontinence.  No urinary retention.  No fever.  Has never had similar symptoms.  No history of stroke or MS.  Patient also started having chest tightness and shortness of breath in the waiting room due to an agitated patient.  Still feeling short of breath at this time.  No history of ACS, PE.  No cough.  No lower extremity swelling or calf tenderness.   History provided by patient.    Past Medical History:  Diagnosis Date   Anemia    Family history of adverse reaction to anesthesia    1st cousin-never woke back up after surgery   GERD (gastroesophageal reflux disease)    h/o   History of palpitations     Past Surgical History:  Procedure Laterality Date   ABLATION     CARPAL TUNNEL RELEASE Right 09/30/2018   Procedure: CARPAL TUNNEL RELEASE;  Surgeon: Juanell Fairly, MD;  Location: [facility] ORS;  Service: Orthopedics;  Laterality: Right;    MEDICATIONS:  Prior to Admission medications   Medication Sig Start Date End Date Taking? Authorizing Provider  benzonatate (TESSALON) 100 MG capsule Take 1 capsule (100 mg total) by mouth 3 (three) times daily as needed. 08/06/22   Margaretann Loveless, [name]  Cholecalciferol (VITAMIN D3) 1.25 MG (50000 UT) CAPS Take  1 tablet by mouth once a week. X 4 weeks    [provider]  fluticasone (FLONASE) 50 MCG/ACT nasal spray Place 2 sprays into both nostrils daily. 08/06/22   Margaretann Loveless, [name]  lidocaine (XYLOCAINE) 2 % solution Swallow 5mL every 4-6 hours as needed for sore throat 08/06/22   Margaretann Loveless, [name]  phenazopyridine (PYRIDIUM) 200 MG tablet Take 1 tablet (200 mg total) by mouth 3 (three) times daily. 07/10/21   Rodriguez-Southworth, Nettie Elm, [name]  promethazine (PHENERGAN) 25 MG tablet Take 1 tablet (25 mg total) by mouth every 6 (six) hours as needed for nausea or vomiting. 01/22/22   Rodriguez-Southworth, Nettie Elm, [name]  promethazine-dextromethorphan (PROMETHAZINE-DM) 6.25-15 MG/5ML syrup Take 5 mLs by mouth 4 (four) times daily as needed. 08/06/22   Margaretann Loveless, [name]    Physical Exam   Triage Vital Signs: ED Triage Vitals  Enc Vitals Group     BP 12/02/22 0226 118/67     Pulse Rate 12/02/22 0226 85     Resp 12/02/22 0226 18     Temp 12/02/22 0226 98.6 F (37 C)     Temp Source 12/02/22 0226 Oral     SpO2 12/02/22 0226 100 %     Weight 12/02/22 0227 156 lb (70.8 kg)     Height 12/02/22 0227  (1.626 m)     Head Circumference --  Peak Flow --      Pain Score 12/02/22 0226 8     Pain Loc --      Pain Edu? --      Excl. in GC? --     Most recent vital signs: Vitals:   12/02/22 0226  BP: 118/67  Pulse: 85  Resp: 18  Temp: 98.6 F (37 C)  SpO2: 100%    CONSTITUTIONAL: Alert, responds appropriately to questions. Well-appearing; well-nourished HEAD: Normocephalic, atraumatic EYES: Conjunctivae clear, pupils appear equal, sclera nonicteric ENT: normal nose; moist mucous membranes NECK: Supple, normal ROM, no midline spinal tenderness or step-off or deformity  CARD: RRR; S1 and S2 appreciated RESP: Normal chest excursion without splinting or tachypnea; breath sounds clear and equal bilaterally; no wheezes, no rhonchi, no rales, no hypoxia or  respiratory distress, speaking full sentences ABD/GI: Non-distended; soft, non-tender, no rebound, no guarding, no peritoneal signs BACK: The back appears normal EXT: Normal ROM in all joints; no deformity noted, no edema SKIN: Normal color for age and race; warm; no rash on exposed skin NEURO: Moves all extremities equally, normal speech, strength diminished in the left upper and lower extremity compared to the right but able to lift against gravity.  Diminished sensation in the left arm and leg compared to the right but normal sensation in the face.  Cranial nerves II through XII intact.  Normal speech. PSYCH: The patient's mood and manner are appropriate.   ED Results / Procedures / Treatments   LABS: (all labs ordered are listed, but only abnormal results are displayed) Labs Reviewed  BASIC METABOLIC PANEL - Abnormal; Notable for the following components:      Result Value   Potassium 3.2 (*)    CO2 18 (*)    Glucose, Bld 119 (*)    All other components within normal limits  CBG MONITORING, ED - Abnormal; Notable for the following components:   Glucose-Capillary 125 (*)    All other components within normal limits  CBC  HCG, QUANTITATIVE, PREGNANCY  MAGNESIUM  URINALYSIS, ROUTINE W REFLEX MICROSCOPIC  TROPONIN I (HIGH SENSITIVITY)  TROPONIN I (HIGH SENSITIVITY)     EKG:  EKG Interpretation  Date/Time:  Sunday December 02 2022 02:36:14 EDT Ventricular Rate:  85 PR Interval:  158 QRS Duration: 84 QT Interval:  354 QTC Calculation: 421 R Axis:   23 Text Interpretation: Normal sinus rhythm Nonspecific ST abnormality Abnormal ECG When compared with ECG of 28-Aug-2018 08:56, Premature atrial complexes are no longer Present Confirmed by Rochele Raring [phone]) on 12/02/2022 6:56:29 AM         RADIOLOGY: My personal review and interpretation of imaging: Chest x-ray clear.  CT scans, MRI pending.  I have personally reviewed all radiology reports.   DG Chest Portable 1  View  Result Date: 12/02/2022 CLINICAL DATA:  Chest pain and shortness of breath. EXAM: PORTABLE CHEST 1 VIEW COMPARISON:  02/06/2021 FINDINGS: The heart size and mediastinal contours are within normal limits. Both lungs are clear. The visualized skeletal structures are unremarkable. IMPRESSION: No active disease. Electronically Signed   By: Signa Kell M.D.   On: 12/02/2022 06:06     PROCEDURES:  Critical Care performed: No      .1-3 Lead EKG Interpretation  Performed by: Raeshawn Tafolla, Layla Maw, DO Authorized by: Abbie Berling, Layla Maw, DO     Interpretation: normal     ECG rate:  85   ECG rate assessment: normal     Rhythm: sinus rhythm  Ectopy: none     Conduction: normal       IMPRESSION / MDM / ASSESSMENT AND PLAN / ED COURSE  I reviewed the triage vital signs and the nursing notes.    Patient here with headache, neck pain, focal neurologic deficits.  Also having chest pain and shortness of breath after an interaction with an agitated patient in the waiting room.  The patient is on the cardiac monitor to evaluate for evidence of arrhythmia and/or significant heart rate changes.   DIFFERENTIAL DIAGNOSIS (includes but not limited to):   CVA, MS, intracranial hemorrhage, dissection, CVT, complex migraine.  As for her chest pain, differential includes anxiety, stress, ACS, less likely PE.   Patient's presentation is most consistent with acute presentation with potential threat to life or bodily function.   PLAN: Workup from triage reveals a slight hypokalemia without EKG changes.  Will give oral replacement.  Magnesium level normal.  No leukocytosis, normal hemoglobin.  Will check troponin x 2, chest x-ray.  EKG shows no ischemia.  Will obtain CTA of the head and neck but I feel she will also need MRI of the brain and cervical spine with and without contrast.  Will give Toradol for her headache as well as IV fluids.   MEDICATIONS GIVEN IN ED: Medications  sodium chloride  0.9 % bolus 1,000 mL (1,000 mLs Intravenous New Bag/Given 12/02/22 0611)  ketorolac (TORADOL) 30 MG/ML injection 30 mg (30 mg Intravenous Given 12/02/22 0613)  potassium chloride SA (KLOR-CON M) CR tablet 40 mEq (40 mEq Oral Given 12/02/22 0602)  iohexol (OMNIPAQUE) 350 MG/ML injection 75 mL (75 mLs Intravenous Contrast Given 12/02/22 1610)     ED COURSE: First troponin negative.  Second pending.  Chest x-ray reviewed and interpreted by myself and the radiologist and is clear.  No cardiomegaly or widened mediastinum.  CTA head and neck, MRI of the brain and cervical spine without contrast pending.  Signed out to oncoming ED physician.   CONSULTS: Pending further workup.   OUTSIDE RECORDS REVIEWED: Reviewed previous family medicine notes in 2022.       FINAL CLINICAL IMPRESSION(S) / ED DIAGNOSES   Final diagnoses:  Left sided numbness  Left-sided weakness  Atypical chest pain     Rx / DC Orders   ED Discharge Orders     None        Note:  This document was prepared using Dragon voice recognition software and may include unintentional dictation errors.   Eluterio Seymour, Layla Maw, DO 12/02/22 [phone]

## 2022-12-02 NOTE — ED Provider Notes (Signed)
-----------------------------------------   7:32 AM on 12/02/2022 -----------------------------------------  Blood pressure 118/67, pulse 85, temperature 98.6 F (37 C), temperature source Oral, resp. rate 18, height 5\' 4"  (1.626 m), weight 70.8 kg, last menstrual period 11/04/2022, SpO2 100 %.  Assuming care from Dr. Elesa Massed.  In short, Amanda Sparks is a 47 y.o. female with a chief complaint of Weakness .  Refer to the original H&P for additional details.  The current plan of care is to follow-up CTA head and neck along with MRI brain and cervical spine w/wo contrast for left sided numbness and weakness.  ----------------------------------------- 11:31 AM on 12/02/2022 ----------------------------------------- CTA head and neck is negative for acute process, MRI brain shows white matter lesions but no evidence of active demyelination.  MRI cervical spine shows evidence of cord compression at C4-5 and C5-6 along with potential demyelinating lesion on the right side of the cord.  Findings reviewed with Dr. Iver Nestle of neurology and Dr. Adriana Simas of neurosurgery, both of whom will evaluate the patient.  .Lumbar Puncture  Date/Time: 12/02/2022 2:41 PM  Performed by: Chesley Noon, MD Authorized by: Chesley Noon, MD   Consent:    Consent obtained:  Written   Consent given by:  Patient   Risks, benefits, and alternatives were discussed: yes     Risks discussed:  Bleeding, infection, pain, repeat procedure, nerve damage and headache Universal protocol:    Procedure explained and questions answered to patient or proxy's satisfaction: yes     Relevant documents present and verified: yes     Test results available: yes     Imaging studies available: yes     Required blood products, implants, devices, and special equipment available: yes     Immediately prior to procedure a time out was called: yes     Site/side marked: yes     Patient identity confirmed:  Verbally with patient and arm  band Pre-procedure details:    Procedure purpose:  Diagnostic   Preparation: Patient was prepped and draped in usual sterile fashion   Anesthesia:    Anesthesia method:  Local infiltration   Local anesthetic:  Lidocaine 1% w/o epi Procedure details:    Lumbar space:  L4-L5 interspace   Patient position:  Sitting   Needle gauge:  22   Needle type:  Spinal needle - Quincke tip   Needle length (in):  1.5   Ultrasound guidance: no     Number of attempts:  1   Fluid appearance:  Clear   Tubes of fluid:  4   Total volume (ml):  5 Post-procedure details:    Puncture site:  Adhesive bandage applied and direct pressure applied   Procedure completion:  Tolerated well, no immediate complications  ----------------------------------------- 2:42 PM on 12/02/2022 ----------------------------------------- Patient evaluated by neurology, who recommends lumbar puncture to further assess for MS.  This was performed with return of clear CSF, studies ordered by neurology.  Patient also evaluated by Dr. Adriana Simas of neurosurgery, recommends further assessment with T-spine MRI, which was also ordered by neurology.  Case discussed with hospitalist for admission.     Chesley Noon, MD 12/02/22 1444

## 2022-12-02 NOTE — ED Notes (Signed)
Patient to MRI at this time.

## 2022-12-03 ENCOUNTER — Telehealth: Payer: Self-pay

## 2022-12-03 LAB — CSF CULTURE W GRAM STAIN

## 2022-12-03 NOTE — Telephone Encounter (Signed)
I have tried calling the patient 2 times and tried calling emergency contact, voice mail not set up. I will try again before I leave.

## 2022-12-03 NOTE — Telephone Encounter (Signed)
Dr Adriana Simas saw this patient as a consult on 12/02/22:  "47 yo female with 2 weeks of some leg pain but numbness throughout left arm and leg but not torso and some mild grip weakness, reflexes normal. She does have some chronic compression at C4-6 with some cord signal change and enhancement. Neurology working up other etiologies but brain MRI normal. She may be a candidate for elective decompression and fusion but I am not sure it explains her current symptoms. "

## 2022-12-03 NOTE — Telephone Encounter (Signed)
Ok, if not, I believe he has an opening Thursday

## 2022-12-03 NOTE — Telephone Encounter (Signed)
She requested Thursday since she has to work tomorrow.

## 2022-12-05 LAB — OLIGOCLONAL BANDS, CSF + SERM

## 2022-12-05 NOTE — Progress Notes (Unsigned)
Referring Physician:  Lucy Chris, MD [address] [place],  [place] [address]  Primary Physician:  Armando Gang, FNP  History of Present Illness: 12/05/2022 Ms. Jaymeson Bakeman is here today with a chief complaint of ***  Left-sided numbness and weakness? Any back pain?  Duration: ***2-3 weeks? Location: *** Quality: ***numbness Severity: ***  Precipitating: aggravated by *** Modifying factors: made better by ***nothing Weakness: none Timing: ***constant Bowel/Bladder Dysfunction: none  Conservative measures:  Physical therapy: *** has not participated in? Multimodal medical therapy including regular antiinflammatories: *** none Injections: *** has not received epidural steroid injections  Past Surgery: ***denies  Paetyn Westermann has ***no symptoms of cervical myelopathy.  The symptoms are causing a significant impact on the patient's life.   I have utilized the care everywhere function in epic to review the outside records available from external health systems.  Review of Systems:  A 10 point review of systems is negative, except for the pertinent positives and negatives detailed in the HPI.  Past Medical History: Past Medical History:  Diagnosis Date   Anemia    Family history of adverse reaction to anesthesia    1st cousin-never woke back up after surgery   GERD (gastroesophageal reflux disease)    h/o   History of palpitations     Past Surgical History: Past Surgical History:  Procedure Laterality Date   ABLATION     CARPAL TUNNEL RELEASE Right 09/30/2018   Procedure: CARPAL TUNNEL RELEASE;  Surgeon: Juanell Fairly, MD;  Location: [facility] ORS;  Service: Orthopedics;  Laterality: Right;    Allergies: Allergies as of 12/06/2022 - Review Complete 12/02/2022  Allergen Reaction Noted   Bactrim [sulfamethoxazole-trimethoprim] Rash 08/30/2015    Medications:  Current Outpatient Medications:    benzonatate (TESSALON) 100 MG capsule,  Take 1 capsule (100 mg total) by mouth 3 (three) times daily as needed., Disp: 30 capsule, Rfl: 0   Cholecalciferol (VITAMIN D3) 1.25 MG (50000 UT) CAPS, Take 1 tablet by mouth once a week. X 4 weeks, Disp: , Rfl:    fluticasone (FLONASE) 50 MCG/ACT nasal spray, Place 2 sprays into both nostrils daily., Disp: 16 g, Rfl: 0   lidocaine (XYLOCAINE) 2 % solution, Swallow 5mL every 4-6 hours as needed for sore throat, Disp: 100 mL, Rfl: 0   phenazopyridine (PYRIDIUM) 200 MG tablet, Take 1 tablet (200 mg total) by mouth 3 (three) times daily., Disp: 6 tablet, Rfl: 0   promethazine (PHENERGAN) 25 MG tablet, Take 1 tablet (25 mg total) by mouth every 6 (six) hours as needed for nausea or vomiting., Disp: 10 tablet, Rfl: 0   promethazine-dextromethorphan (PROMETHAZINE-DM) 6.25-15 MG/5ML syrup, Take 5 mLs by mouth 4 (four) times daily as needed., Disp: 118 mL, Rfl: 0  Social History: Social History   Tobacco Use   Smoking status: Never   Smokeless tobacco: Never  Vaping Use   Vaping Use: Never used  Substance Use Topics   Alcohol use: No    Alcohol/week: 0.0 standard drinks of alcohol   Drug use: No    Family Medical History: Family History  Problem Relation Age of Onset   Hypertension Mother    Diabetes Father    Breast cancer Maternal Grandmother     Physical Examination: There were no vitals filed for this visit.  General: Patient is well developed, well nourished, calm, collected, and in no apparent distress. Attention to examination is appropriate.  Neck:   Supple.  Full range of motion.  Respiratory:  Patient is breathing without any difficulty.   NEUROLOGICAL:     Awake, alert, oriented to person, place, and time.  Speech is clear and fluent.   Cranial Nerves: Pupils equal round and reactive to light.  Facial tone is symmetric.  Facial sensation is symmetric. Shoulder shrug is symmetric. Tongue protrusion is midline.  There is no pronator drift.  ROM of spine: full.     Strength: Side Biceps Triceps Deltoid Interossei Grip Wrist Ext. Wrist Flex.  R 5 5 5 5 5 5 5   L 5 5 5 5 5 5 5    Side Iliopsoas Quads Hamstring PF DF EHL  R 5 5 5 5 5 5   L 5 5 5 5 5 5    Reflexes are ***2+ and symmetric at the biceps, triceps, brachioradialis, patella and achilles.   Hoffman's is absent.   Bilateral upper and lower extremity sensation is intact to light touch.    No evidence of dysmetria noted.  Gait is normal.     Medical Decision Making  Imaging: ***  I have personally reviewed the images and agree with the above interpretation.  Assessment and Plan: Ms. Nigh is a pleasant 47 y.o. female with ***    Thank you for involving me in the care of this patient.      Chester K. Myer Haff MD, [facility] Neurosurgery

## 2022-12-06 ENCOUNTER — Encounter: Payer: Self-pay | Admitting: Neurosurgery

## 2022-12-06 ENCOUNTER — Ambulatory Visit (INDEPENDENT_AMBULATORY_CARE_PROVIDER_SITE_OTHER): Payer: Commercial Managed Care - PPO | Admitting: Neurosurgery

## 2022-12-06 VITALS — BP 120/78 | Ht 64.0 in | Wt 157.8 lb

## 2022-12-06 DIAGNOSIS — R29898 Other symptoms and signs involving the musculoskeletal system: Secondary | ICD-10-CM | POA: Diagnosis not present

## 2022-12-06 DIAGNOSIS — R29818 Other symptoms and signs involving the nervous system: Secondary | ICD-10-CM

## 2022-12-06 DIAGNOSIS — G959 Disease of spinal cord, unspecified: Secondary | ICD-10-CM | POA: Diagnosis not present

## 2022-12-06 DIAGNOSIS — M4802 Spinal stenosis, cervical region: Secondary | ICD-10-CM | POA: Diagnosis not present

## 2022-12-06 LAB — CSF CULTURE W GRAM STAIN: Culture: NO GROWTH

## 2022-12-06 NOTE — Progress Notes (Signed)
Referring Physician:  Lucy Chris, MD [address] [place],  [place] [address]  Primary Physician:  Armando Gang, FNP  History of Present Illness: 12/06/2022 [name] is here today with a chief complaint of left-sided numbness and weakness. She has some back pain from the lumbar puncture that was performed at the hospital.   She has had worsening numbness over left arm and leg.  She is also got some weakness of her hand and forearm.  She has had at least 1 episode where she had a shocklike pain down her arm that left her hand very tingly.  The symptoms have been ongoing for 2 to 3 weeks.  Numbness is her primary concern.  It is very bothersome.   Bowel/Bladder Dysfunction: none  Conservative measures:  Physical therapy: has not participated in Multimodal medical therapy including regular antiinflammatories:  none Injections:  has not received epidural steroid injections  Past Surgery: denies  Amanda Sparks has symptoms of cervical myelopathy.  The symptoms are causing a significant impact on the patient's life.   I have utilized the care everywhere function in epic to review the outside records available from external health systems.  Review of Systems:  A 10 point review of systems is negative, except for the pertinent positives and negatives detailed in the HPI.  Past Medical History: Past Medical History:  Diagnosis Date   Anemia    Family history of adverse reaction to anesthesia    1st cousin-never woke back up after surgery   GERD (gastroesophageal reflux disease)    h/o   History of palpitations     Past Surgical History: Past Surgical History:  Procedure Laterality Date   ABLATION     CARPAL TUNNEL RELEASE Right 09/30/2018   Procedure: CARPAL TUNNEL RELEASE;  Surgeon: Juanell Fairly, MD;  Location: [facility] ORS;  Service: Orthopedics;  Laterality: Right;    Allergies: Allergies as of 12/06/2022 - Review Complete 12/06/2022   Allergen Reaction Noted   Bactrim [sulfamethoxazole-trimethoprim] Rash 08/30/2015    Medications:  Current Outpatient Medications:    Cholecalciferol (VITAMIN D3) 1.25 MG (50000 UT) CAPS, Take 1 tablet by mouth once a week. X 4 weeks, Disp: , Rfl:    citalopram (CELEXA) 20 MG tablet, Take 20 mg by mouth daily., Disp: , Rfl:    fluticasone (FLONASE) 50 MCG/ACT nasal spray, Place 2 sprays into both nostrils daily., Disp: 16 g, Rfl: 0   phenazopyridine (PYRIDIUM) 200 MG tablet, Take 1 tablet (200 mg total) by mouth 3 (three) times daily., Disp: 6 tablet, Rfl: 0   traZODone (DESYREL) 150 MG tablet, Take 1 tablet by mouth at bedtime., Disp: , Rfl:   Social History: Social History   Tobacco Use   Smoking status: Never   Smokeless tobacco: Never  Vaping Use   Vaping Use: Never used  Substance Use Topics   Alcohol use: No    Alcohol/week: 0.0 standard drinks of alcohol   Drug use: No    Family Medical History: Family History  Problem Relation Age of Onset   Hypertension Mother    Diabetes Father    Breast cancer Maternal Grandmother     Physical Examination: Vitals:   12/06/22 1511  BP: 120/78    General: Patient is well developed, well nourished, calm, collected, and in no apparent distress. Attention to examination is appropriate.  Neck:   Supple.  Full range of motion.  Respiratory: Patient is breathing without any difficulty.   NEUROLOGICAL:  Awake, alert, oriented to person, place, and time.  Speech is clear and fluent.   Cranial Nerves: Pupils equal round and reactive to light.  Facial tone is symmetric.  Facial sensation is symmetric. Shoulder shrug is symmetric. Tongue protrusion is midline.  There is no pronator drift.  ROM of spine: full.    Strength: Side Biceps Triceps Deltoid Interossei Grip Wrist Ext. Wrist Flex.  R L 4+ 4+ 4+ 4 4- 4 4   Side Iliopsoas Quads Hamstring PF DF EHL  R L 4+ Reflexes are 1+  and symmetric at the biceps, triceps, brachioradialis, patella and achilles.   Hoffman's is present.   Bilateral upper and lower extremity sensation shows altered light tough on her L hemibody.    No evidence of dysmetria noted.  Gait is normal.     Medical Decision Making  Imaging: MR C spine 12/02/2022 IMPRESSION: 1. 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 Findings are most pronounced at the C4-5 and C5-6 levels where there is severe canal stenosis with compression of the cord. 2. Small focus of T2/STIR hyperintense signal within the right hemicord at the C4-5 level. There is postcontrast enhancement at this area following the administration of gadolinium contrast. Although acute compressive myelomalacia could have this appearance, findings are concerning for an active demyelinating lesion given the patient's history. 3. No additional sites of focal cord signal abnormality are identified within the cervical spine. 4. Multilevel bilateral foraminal stenosis, most pronounced at the C4-5 level.     Electronically Signed   By: Duanne Guess D.O.   On: 12/02/2022 10:32  I have personally reviewed the images and agree with the above interpretation.  Assessment and Plan: [name] is a pleasant 47 y.o. female with cervical myelopathy due to severe stenosis at C4-5 and C5-6 as well as behind the C5 vertebral body.  She has myelomalacia.  She is symptomatic from this.  Given her history of a positive Lhermitte sign as well as weakness on my examination, I recommended surgical intervention.  There is no role for conservative management.  I recommended C5 corpectomy to allow for full decompression from the C4-5 displaced C5-6 disc base.  I think corpectomy is required as she still has severe stenosis behind the C5 vertebral body.  She would like to move forward, but will call back when she has figured out appropriate arrangements for work.  I discussed the  planned procedure at length with the patient, including the risks, benefits, alternatives, and indications. The risks discussed include but are not limited to bleeding, infection, need for reoperation, spinal fluid leak, stroke, vision loss, anesthetic complication, coma, paralysis, and even death. We also discussed the possibility of post-operative dysphagia, vocal cord paralysis, and the risk of adjacent segment disease in the future. I also described in detail that improvement was not guaranteed.  The patient expressed understanding of these risks, and asked that we proceed with surgery. I described the surgery in layman's terms, and gave ample opportunity for questions, which were answered to the best of my ability.     Thank you for involving me in the care of this patient.      Kanylah Muench K. Myer Haff MD, [facility] Neurosurgery

## 2023-02-05 ENCOUNTER — Ambulatory Visit: Payer: Commercial Managed Care - PPO | Admitting: Neurosurgery

## 2023-02-07 ENCOUNTER — Ambulatory Visit: Payer: Commercial Managed Care - PPO

## 2023-02-07 DIAGNOSIS — Z1211 Encounter for screening for malignant neoplasm of colon: Secondary | ICD-10-CM | POA: Diagnosis present

## 2023-02-07 DIAGNOSIS — K5730 Diverticulosis of large intestine without perforation or abscess without bleeding: Secondary | ICD-10-CM | POA: Diagnosis not present

## 2023-02-07 DIAGNOSIS — K640 First degree hemorrhoids: Secondary | ICD-10-CM | POA: Diagnosis not present

## 2023-02-07 DIAGNOSIS — Z862 Personal history of diseases of the blood and blood-forming organs and certain disorders involving the immune mechanism: Secondary | ICD-10-CM | POA: Diagnosis not present

## 2023-02-07 DIAGNOSIS — Z800 Family history of malignant neoplasm of digestive organs: Secondary | ICD-10-CM | POA: Diagnosis not present

## 2023-03-18 ENCOUNTER — Other Ambulatory Visit: Payer: Self-pay | Admitting: Family Medicine

## 2023-03-18 DIAGNOSIS — Z1231 Encounter for screening mammogram for malignant neoplasm of breast: Secondary | ICD-10-CM

## 2023-03-27 ENCOUNTER — Inpatient Hospital Stay
Admission: RE | Admit: 2023-03-27 | Discharge: 2023-03-27 | Disposition: A | Discharge status: Home / Self Care | Payer: Self-pay | Source: Ambulatory Visit | Attending: Family Medicine | Admitting: Family Medicine

## 2023-03-27 ENCOUNTER — Other Ambulatory Visit: Payer: Self-pay | Admitting: *Deleted

## 2023-03-27 DIAGNOSIS — Z1231 Encounter for screening mammogram for malignant neoplasm of breast: Secondary | ICD-10-CM

## 2023-04-18 ENCOUNTER — Ambulatory Visit
Admission: RE | Admit: 2023-04-18 | Discharge: 2023-04-18 | Disposition: A | Discharge status: Home / Self Care | Payer: Commercial Managed Care - PPO | Source: Ambulatory Visit | Attending: Family Medicine | Admitting: Family Medicine

## 2023-04-18 DIAGNOSIS — Z1231 Encounter for screening mammogram for malignant neoplasm of breast: Secondary | ICD-10-CM | POA: Diagnosis not present

## 2023-07-24 ENCOUNTER — Emergency Department
Admission: EM | Admit: 2023-07-24 | Discharge: 2023-07-24 | Disposition: A | Discharge status: Home / Self Care | Payer: Commercial Managed Care - PPO | Attending: Emergency Medicine | Admitting: Emergency Medicine

## 2023-07-24 ENCOUNTER — Emergency Department: Payer: Commercial Managed Care - PPO

## 2023-07-24 ENCOUNTER — Other Ambulatory Visit: Payer: Self-pay

## 2023-07-24 DIAGNOSIS — R519 Headache, unspecified: Secondary | ICD-10-CM | POA: Diagnosis not present

## 2023-07-24 DIAGNOSIS — R0789 Other chest pain: Secondary | ICD-10-CM | POA: Diagnosis present

## 2023-07-24 LAB — CBC
HCT: 42.5 % (ref 36.0–46.0)
Hemoglobin: 13.6 g/dL (ref 12.0–15.0)
MCH: 26.9 pg (ref 26.0–34.0)
MCHC: 32 g/dL (ref 30.0–36.0)
MCV: 84.2 fL (ref 80.0–100.0)
Platelets: 296 10*3/uL (ref 150–400)
RBC: 5.05 MIL/uL (ref 3.87–5.11)
RDW: 12.9 % (ref 11.5–15.5)
WBC: 6.1 10*3/uL (ref 4.0–10.5)
nRBC: 0 % (ref 0.0–0.2)

## 2023-07-24 LAB — COMPREHENSIVE METABOLIC PANEL
ALT: 13 U/L (ref 0–44)
AST: 13 U/L — ABNORMAL LOW (ref 15–41)
Albumin: 4.5 g/dL (ref 3.5–5.0)
Alkaline Phosphatase: 39 U/L (ref 38–126)
Anion gap: 8 (ref 5–15)
BUN: 7 mg/dL (ref 6–20)
CO2: 23 mmol/L (ref 22–32)
Calcium: 9.2 mg/dL (ref 8.9–10.3)
Chloride: 106 mmol/L (ref 98–111)
Creatinine, Ser: 0.66 mg/dL (ref 0.44–1.00)
GFR, Estimated: >60 mL/min (ref >60)
Glucose, Bld: 118 mg/dL — ABNORMAL HIGH (ref 70–99)
Potassium: 3.3 mmol/L — ABNORMAL LOW (ref 3.5–5.1)
Sodium: 137 mmol/L (ref 135–145)
Total Bilirubin: 0.7 mg/dL (ref <1.2)
Total Protein: 7.5 g/dL (ref 6.5–8.1)

## 2023-07-24 LAB — URINALYSIS, ROUTINE W REFLEX MICROSCOPIC
Bilirubin Urine: NEGATIVE
Glucose, UA: NEGATIVE mg/dL
Hgb urine dipstick: NEGATIVE
Ketones, ur: NEGATIVE mg/dL
Leukocytes,Ua: NEGATIVE
Nitrite: NEGATIVE
Protein, ur: NEGATIVE mg/dL
Specific Gravity, Urine: 1.006 (ref 1.005–1.030)
pH: 8 (ref 5.0–8.0)

## 2023-07-24 LAB — POC URINE PREG, ED: Preg Test, Ur: NEGATIVE

## 2023-07-24 LAB — TROPONIN I (HIGH SENSITIVITY)
Troponin I (High Sensitivity): 7 ng/L (ref <18)
Troponin I (High Sensitivity): 7 ng/L (ref <18)

## 2023-07-24 LAB — CBG MONITORING, ED: Glucose-Capillary: 98 mg/dL (ref 70–99)

## 2023-07-24 MED ORDER — KETOROLAC TROMETHAMINE 30 MG/ML IJ SOLN
30.0000 mg | Freq: Once | INTRAMUSCULAR | Status: AC
  Administered 2023-07-24: 30 mg via INTRAVENOUS
  Filled 2023-07-24: qty 1

## 2023-07-24 MED ORDER — SODIUM CHLORIDE 0.9 % IV BOLUS (SEPSIS)
1000.0000 mL | Freq: Once | INTRAVENOUS | Status: AC
  Administered 2023-07-24: 1000 mL via INTRAVENOUS

## 2023-07-24 NOTE — Discharge Instructions (Signed)
Your cardiac enzymes today were normal.  Your chest x-ray was clear.  EKG showed no new changes.  Please follow-up with your primary care doctor.  Your blood sugar was also 98.  You do not need to be started on medications currently for diabetes but this can be rechecked by your PCP as well as your hemoglobin A1c.

## 2023-07-24 NOTE — ED Triage Notes (Signed)
Pt presents to ER with c/o chest pain that woke her from her sleep tonight.  Pt states pain was non-radiating in nature, and felt like a "sharp throbbing."  Pt also reports an episode of vomiting after the pain started.  Pt also reports feeling more weak than she normally does tonight.  Pt is otherwise A&O x4 and in NAD at this time.

## 2023-07-24 NOTE — ED Notes (Signed)
Assessment: patient currently denies chest pain, just states she "feels weak."

## 2023-07-24 NOTE — ED Provider Notes (Signed)
[facility] Provider Note    Event Date/Time   First MD Initiated Contact with Patient 07/24/23 [phone]     (approximate)   History   Chest Pain and Weakness   HPI  Amanda Sparks is a 47 y.o. female with history of anemia who presents to the emergency department with throbbing central chest pain that has resolved.  Also complaining of headache.  No associated fevers, cough, shortness of breath, nausea, vomiting, dizziness, diaphoresis.  No history of PE, DVT, exogenous estrogen use, recent fractures, surgery, trauma, hospitalization, prolonged travel or other immobilization. No lower extremity swelling or pain. No calf tenderness.  History provided by patient, family.    Past Medical History:  Diagnosis Date   Anemia    Family history of adverse reaction to anesthesia    1st cousin-never woke back up after surgery   GERD (gastroesophageal reflux disease)    h/o   History of palpitations     Past Surgical History:  Procedure Laterality Date   ABLATION     CARPAL TUNNEL RELEASE Right 09/30/2018   Procedure: CARPAL TUNNEL RELEASE;  Surgeon: Juanell Fairly, MD;  Location: [facility] ORS;  Service: Orthopedics;  Laterality: Right;    MEDICATIONS:  Prior to Admission medications   Medication Sig Start Date End Date Taking? Authorizing Provider  Cholecalciferol (VITAMIN D3) 1.25 MG (50000 UT) CAPS Take 1 tablet by mouth once a week. X 4 weeks    [provider]  citalopram (CELEXA) 20 MG tablet Take 20 mg by mouth daily.    [provider]  fluticasone (FLONASE) 50 MCG/ACT nasal spray Place 2 sprays into both nostrils daily. 08/06/22   Margaretann Loveless, [name]  phenazopyridine (PYRIDIUM) 200 MG tablet Take 1 tablet (200 mg total) by mouth 3 (three) times daily. 07/10/21   Rodriguez-Southworth, Nettie Elm, [name]  traZODone (DESYREL) 150 MG tablet Take 1 tablet by mouth at bedtime.    [provider]    Physical Exam   Triage  Vital Signs: ED Triage Vitals  Encounter Vitals Group     BP 07/24/23 0236 113/83     Systolic BP Percentile --      Diastolic BP Percentile --      Pulse Rate 07/24/23 0236 75     Resp 07/24/23 0236 18     Temp 07/24/23 0236 98.5 F (36.9 C)     Temp Source 07/24/23 0236 Oral     SpO2 07/24/23 0236 100 %     Weight 07/24/23 0240 146 lb (66.2 kg)     Height 07/24/23 0240 5\' 4"  (1.626 m)     Head Circumference --      Peak Flow --      Pain Score 07/24/23 0239 9     Pain Loc --      Pain Education --      Exclude from Growth Chart --     Most recent vital signs: Vitals:   07/24/23 0236 07/24/23 0445  BP: 113/83 120/72  Pulse: 75 81  Resp: 18 14  Temp: 98.5 F (36.9 C)   SpO2: 100% 100%    CONSTITUTIONAL: Alert, responds appropriately to questions. Well-appearing; well-nourished HEAD: Normocephalic, atraumatic EYES: Conjunctivae clear, pupils appear equal, sclera nonicteric ENT: normal nose; moist mucous membranes NECK: Supple, normal ROM CARD: RRR; S1 and S2 appreciated RESP: Normal chest excursion without splinting or tachypnea; breath sounds clear and equal bilaterally; no wheezes, no rhonchi, no rales, no hypoxia or respiratory distress, speaking  full sentences ABD/GI: Non-distended; soft, non-tender, no rebound, no guarding, no peritoneal signs BACK: The back appears normal EXT: Normal ROM in all joints; no deformity noted, no edema, no calf tenderness or calf swelling SKIN: Normal color for age and race; warm; no rash on exposed skin NEURO: Moves all extremities equally, normal speech PSYCH: The patient's mood and manner are appropriate.   ED Results / Procedures / Treatments   LABS: (all labs ordered are listed, but only abnormal results are displayed) Labs Reviewed  COMPREHENSIVE METABOLIC PANEL - Abnormal; Notable for the following components:      Result Value   Potassium 3.3 (*)    Glucose, Bld 118 (*)    AST 13 (*)    All other components within  normal limits  URINALYSIS, ROUTINE W REFLEX MICROSCOPIC - Abnormal; Notable for the following components:   Color, Urine STRAW (*)    APPearance CLEAR (*)    All other components within normal limits  CBC  POC URINE PREG, ED  CBG MONITORING, ED  TROPONIN I (HIGH SENSITIVITY)  TROPONIN I (HIGH SENSITIVITY)     EKG:  EKG Interpretation Date/Time:  Wednesday July 24 2023 02:38:12 EST Ventricular Rate:  77 PR Interval:  144 QRS Duration:  86 QT Interval:  408 QTC Calculation: 461 R Axis:   30  Text Interpretation: Normal sinus rhythm Cannot rule out Anterior infarct , age undetermined Abnormal ECG When compared with ECG of 02-Dec-2022 02:36, Nonspecific T wave abnormality, worse in Inferior leads Nonspecific T wave abnormality now evident in Lateral leads Confirmed by Rochele Raring [phone]) on 07/24/2023 4:25:58 AM         RADIOLOGY: My personal review and interpretation of imaging: Chest x-ray clear.  I have personally reviewed all radiology reports.   DG Chest 2 View  Result Date: 07/24/2023 CLINICAL DATA:  Chest pain. EXAM: CHEST - 2 VIEW COMPARISON:  December 02, 2022 FINDINGS: The heart size and mediastinal contours are within normal limits. Both lungs are clear. The visualized skeletal structures are unremarkable. IMPRESSION: No active cardiopulmonary disease. Electronically Signed   By: Aram Candela M.D.   On: 07/24/2023 03:14     PROCEDURES:  Critical Care performed: No      .1-3 Lead EKG Interpretation  Performed by: Lucyle Alumbaugh, Layla Maw, DO Authorized by: Mayson Mcneish, Layla Maw, DO     Interpretation: normal     ECG rate:  81   ECG rate assessment: normal     Rhythm: sinus rhythm     Ectopy: none     Conduction: normal       IMPRESSION / MDM / ASSESSMENT AND PLAN / ED COURSE  I reviewed the triage vital signs and the nursing notes.    Patient here with complaints of chest pain, headache.  The patient is on the cardiac monitor to evaluate for evidence  of arrhythmia and/or significant heart rate changes.   DIFFERENTIAL DIAGNOSIS (includes but not limited to):   Generalized headache, migraine, doubt intracranial hemorrhage, CVA, cavernous sinus thrombosis, meningitis.  Chest pain seemed atypical in nature.  Low suspicion for ACS, PE, dissection, pneumonia, CHF, pneumothorax.   Patient's presentation is most consistent with acute presentation with potential threat to life or bodily function.   PLAN: Patient's labs obtained from triage.  Normal hemoglobin, electrolytes.  First troponin negative.  Second pending.  Urine shows no sign of infection.  Chest x-ray reviewed and interpreted by myself and the radiologist is unremarkable.  EKG shows no new changes  compared to previous.  Patient was also very worried because she states that her blood glucose was elevated with EMS.  She is worried that she has diabetes.  Will recheck.   MEDICATIONS GIVEN IN ED: Medications  sodium chloride 0.9 % bolus 1,000 mL (1,000 mLs Intravenous New Bag/Given 07/24/23 0514)  ketorolac (TORADOL) 30 MG/ML injection 30 mg (30 mg Intravenous Given 07/24/23 0514)     ED COURSE: Patient's second troponin negative.  CBG is 98.  Feeling better after Toradol and fluids for headache.  Will discharge with PCP follow-up.   At this time, I do not feel there is any life-threatening condition present. I reviewed all nursing notes, vitals, pertinent previous records.  All lab and urine results, EKGs, imaging ordered have been independently reviewed and interpreted by myself.  I reviewed all available radiology reports from any imaging ordered this visit.  Based on my assessment, I feel the patient is safe to be discharged home without further emergent workup and can continue workup as an outpatient as needed. Discussed all findings, treatment plan as well as usual and customary return precautions.  They verbalize understanding and are comfortable with this plan.  Outpatient follow-up  has been provided as needed.  All questions have been answered.    CONSULTS:  none   OUTSIDE RECORDS REVIEWED: Reviewed last GI note in May 2024.       FINAL CLINICAL IMPRESSION(S) / ED DIAGNOSES   Final diagnoses:  Atypical chest pain     Rx / DC Orders   ED Discharge Orders     None        Note:  This document was prepared using Dragon voice recognition software and may include unintentional dictation errors.   Kadey Mihalic, Layla Maw, DO 07/24/23 [phone]

## 2023-09-04 ENCOUNTER — Telehealth: Payer: Commercial Managed Care - PPO | Admitting: Physician Assistant

## 2023-09-04 DIAGNOSIS — J019 Acute sinusitis, unspecified: Secondary | ICD-10-CM | POA: Diagnosis not present

## 2023-09-04 DIAGNOSIS — B9689 Other specified bacterial agents as the cause of diseases classified elsewhere: Secondary | ICD-10-CM

## 2023-09-04 MED ORDER — BENZONATATE 100 MG PO CAPS: 100.0000 mg | ORAL_CAPSULE | Freq: Three times a day (TID) | ORAL | 0 refills | Status: AC | PRN

## 2023-09-04 MED ORDER — AMOXICILLIN-POT CLAVULANATE 875-125 MG PO TABS: 1.0000 | ORAL_TABLET | Freq: Two times a day (BID) | ORAL | 0 refills | Status: AC

## 2023-09-04 MED ORDER — FLUTICASONE PROPIONATE 50 MCG/ACT NA SUSP: 2.0000 | Freq: Every day | NASAL | 0 refills | Status: AC

## 2023-09-04 NOTE — Patient Instructions (Signed)
 Amanda Sparks, thank you for joining [name], [name] for today's virtual visit.  While this provider is not your primary care provider (PCP), if your PCP is located in our provider database this encounter information will be shared with them immediately following your visit.   A [facility] MyChart account gives you access to today's visit and all your visits, tests, and labs performed at [facility] " click here if you don't have a [facility] MyChart account or go to [email or web address]  Consent: (Patient) Aye Wilker provided verbal consent for this virtual visit at the beginning of the encounter.  Current Medications:  Current Outpatient Medications:    Cholecalciferol (VITAMIN D3) 1.25 MG (50000 UT) CAPS, Take 1 tablet by mouth once a week. X 4 weeks, Disp: , Rfl:    citalopram (CELEXA) 20 MG tablet, Take 20 mg by mouth daily., Disp: , Rfl:    fluticasone  (FLONASE ) 50 MCG/ACT nasal spray, Place 2 sprays into both nostrils daily., Disp: 16 g, Rfl: 0   phenazopyridine  (PYRIDIUM ) 200 MG tablet, Take 1 tablet (200 mg total) by mouth 3 (three) times daily., Disp: 6 tablet, Rfl: 0   traZODone (DESYREL) 150 MG tablet, Take 1 tablet by mouth at bedtime., Disp: , Rfl:    Medications ordered in this encounter:  No orders of the defined types were placed in this encounter.    *If you need refills on other medications prior to your next appointment, please contact your pharmacy*  Follow-Up: Call back or seek an in-person evaluation if the symptoms worsen or if the condition fails to improve as anticipated.  [facility] [phone]  Other Instructions Please take antibiotic as directed.  Increase fluid intake.  Use Saline nasal spray.  Take a daily multivitamin. Use the Tessalon  and Flonase  as directed as well. Continue Mucinex.  Place a humidifier in the bedroom.  Please call or return clinic if symptoms are not  improving.  Sinusitis Sinusitis is redness, soreness, and swelling (inflammation) of the paranasal sinuses. Paranasal sinuses are air pockets within the bones of your face (beneath the eyes, the middle of the forehead, or above the eyes). In healthy paranasal sinuses, mucus is able to drain out, and air is able to circulate through them by way of your nose. However, when your paranasal sinuses are inflamed, mucus and air can become trapped. This can allow bacteria and other germs to grow and cause infection. Sinusitis can develop quickly and last only a short time (acute) or continue over a long period (chronic). Sinusitis that lasts for more than 12 weeks is considered chronic.  CAUSES  Causes of sinusitis include: Allergies. Structural abnormalities, such as displacement of the cartilage that separates your nostrils (deviated septum), which can decrease the air flow through your nose and sinuses and affect sinus drainage. Functional abnormalities, such as when the small hairs (cilia) that line your sinuses and help remove mucus do not work properly or are not present. SYMPTOMS  Symptoms of acute and chronic sinusitis are the same. The primary symptoms are pain and pressure around the affected sinuses. Other symptoms include: Upper toothache. Earache. Headache. Bad breath. Decreased sense of smell and taste. A cough, which worsens when you are lying flat. Fatigue. Fever. Thick drainage from your nose, which often is green and may contain pus (purulent). Swelling and warmth over the affected sinuses. DIAGNOSIS  Your caregiver will perform a physical exam. During the exam, your caregiver may: Look in your nose  for signs of abnormal growths in your nostrils (nasal polyps). Tap over the affected sinus to check for signs of infection. View the inside of your sinuses (endoscopy) with a special imaging device with a light attached (endoscope), which is inserted into your sinuses. If your  caregiver suspects that you have chronic sinusitis, one or more of the following tests may be recommended: Allergy tests. Nasal culture A sample of mucus is taken from your nose and sent to a lab and screened for bacteria. Nasal cytology A sample of mucus is taken from your nose and examined by your caregiver to determine if your sinusitis is related to an allergy. TREATMENT  Most cases of acute sinusitis are related to a viral infection and will resolve on their own within 10 days. Sometimes medicines are prescribed to help relieve symptoms (pain medicine, decongestants, nasal steroid sprays, or saline sprays).  However, for sinusitis related to a bacterial infection, your caregiver will prescribe antibiotic medicines. These are medicines that will help kill the bacteria causing the infection.  Rarely, sinusitis is caused by a fungal infection. In theses cases, your caregiver will prescribe antifungal medicine. For some cases of chronic sinusitis, surgery is needed. Generally, these are cases in which sinusitis recurs more than 3 times per year, despite other treatments. HOME CARE INSTRUCTIONS  Drink plenty of water. Water helps thin the mucus so your sinuses can drain more easily. Use a humidifier. Inhale steam 3 to 4 times a day (for example, sit in the bathroom with the shower running). Apply a warm, moist washcloth to your face 3 to 4 times a day, or as directed by your caregiver. Use saline nasal sprays to help moisten and clean your sinuses. Take over-the-counter or prescription medicines for pain, discomfort, or fever only as directed by your caregiver. SEEK IMMEDIATE MEDICAL CARE IF: You have increasing pain or severe headaches. You have nausea, vomiting, or drowsiness. You have swelling around your face. You have vision problems. You have a stiff neck. You have difficulty breathing. MAKE SURE YOU:  Understand these instructions. Will watch your condition. Will get help right away  if you are not doing well or get worse. Document Released: 08/06/2005 Document Revised: 10/29/2011 Document Reviewed: 08/21/2011 [facility] Patient Information 2014 [place], [place].    If you have been instructed to have an in-person evaluation today at a local Urgent Care facility, please use the link below. It will take you to a list of all of our available [facility], including address, phone number and hours of operation. Please do not delay care.  [facility]  If you or a family member do not have a primary care provider, use the link below to schedule a visit and establish care. When you choose a [facility] primary care physician or advanced practice provider, you gain a long-term partner in health. Find a Primary Care Provider  Learn more about [place]'s in-office and virtual care options: [place] - Get Care Now

## 2023-09-04 NOTE — Progress Notes (Signed)
 Virtual Visit Consent   Amanda Sparks, you are scheduled for a virtual visit with a [facility] provider today. Just as with appointments in the office, your consent must be obtained to participate. Your consent will be active for this visit and any virtual visit you may have with one of our providers in the next 365 days. If you have a MyChart account, a copy of this consent can be sent to you electronically.  As this is a virtual visit, video technology does not allow for your provider to perform a traditional examination. This may limit your provider's ability to fully assess your condition. If your provider identifies any concerns that need to be evaluated in person or the need to arrange testing (such as labs, EKG, etc.), we will make arrangements to do so. Although advances in technology are sophisticated, we cannot ensure that it will always work on either your end or our end. If the connection with a video visit is poor, the visit may have to be switched to a telephone visit. With either a video or telephone visit, we are not always able to ensure that we have a secure connection.  By engaging in this virtual visit, you consent to the provision of healthcare and authorize for your insurance to be billed (if applicable) for the services provided during this visit. Depending on your insurance coverage, you may receive a charge related to this service.  I need to obtain your verbal consent now. Are you willing to proceed with your visit today? Amanda Sparks has provided verbal consent on 09/04/2023 for a virtual visit (video or telephone). Amanda Sparks, [place]  Date: 09/04/2023 3:10 PM  Virtual Visit via Video Note   I, Amanda Sparks, connected with  Amanda Sparks  ([id number], 1976/08/04) on 09/04/23 at  3:15 PM EST by a video-enabled telemedicine application and verified that I am speaking with the correct person using two identifiers.  Location: Patient: Virtual  Visit Location Patient: Home Provider: Virtual Visit Location Provider: Home Office   I discussed the limitations of evaluation and management by telemedicine and the availability of in person appointments. The patient expressed understanding and agreed to proceed.    History of Present Illness: Amanda Sparks is a 48 y.o. who identifies as a female who was assigned female at birth, and is being seen today for URI symptoms worsening 2.5 days ago with nasal congestion, sore throat, headache (L>R), Notes thick mucous production with coughing, now turning green. Notes sinus pain. Notes some milder symptoms for the past week prior to this change. Denies fever, chills. 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 Took home COVID test that was negative.   OTC -- Mucinex, Theraflu.   HPI: HPI  Problems:  Patient Active Problem List   Diagnosis Date Noted   Weakness 12/02/2022    Allergies:  Allergies  Allergen Reactions   Bactrim [Sulfamethoxazole-Trimethoprim] Rash   Medications:  Current Outpatient Medications:    amoxicillin -clavulanate (AUGMENTIN ) 875-125 MG tablet, Take 1 tablet by mouth 2 (two) times daily., Disp: 14 tablet, Rfl: 0   benzonatate  (TESSALON ) 100 MG capsule, Take 1 capsule (100 mg total) by mouth 3 (three) times daily as needed for cough., Disp: 30 capsule, Rfl: 0   fluticasone  (FLONASE ) 50 MCG/ACT nasal spray, Place 2 sprays into both nostrils daily., Disp: 16 g, Rfl: 0   Cholecalciferol (VITAMIN D3) 1.25 MG (50000 UT) CAPS, Take 1 tablet by mouth once a week. X  4 weeks, Disp: , Rfl:    citalopram (CELEXA) 20 MG tablet, Take 20 mg by mouth daily., Disp: , Rfl:    phenazopyridine  (PYRIDIUM ) 200 MG tablet, Take 1 tablet (200 mg total) by mouth 3 (three) times daily., Disp: 6 tablet, Rfl: 0   traZODone (DESYREL) 150 MG tablet, Take 1 tablet by mouth at bedtime., Disp: , Rfl:   Observations/Objective: Patient is well-developed, well-nourished in no acute  distress.  Resting comfortably at home.  Head is normocephalic, atraumatic.  No labored breathing. Speech is clear and coherent with logical content.  Patient is alert and oriented at baseline.   Assessment and Plan: 1. Acute bacterial sinusitis (Primary) - fluticasone  (FLONASE ) 50 MCG/ACT nasal spray; Place 2 sprays into both nostrils daily.  Dispense: 16 g; Refill: 0 - benzonatate  (TESSALON ) 100 MG capsule; Take 1 capsule (100 mg total) by mouth 3 (three) times daily as needed for cough.  Dispense: 30 capsule; Refill: 0 - amoxicillin -clavulanate (AUGMENTIN ) 875-125 MG tablet; Take 1 tablet by mouth 2 (two) times daily.  Dispense: 14 tablet; Refill: 0  Rx Augmentin .  Increase fluids.  Rest.  Saline nasal spray.  Probiotic.  Mucinex as directed.  Humidifier in bedroom. Tessalon  and Flonase  per orders.  Call or return to clinic if symptoms are not improving.   Follow Up Instructions: I discussed the assessment and treatment plan with the patient. The patient was provided an opportunity to ask questions and all were answered. The patient agreed with the plan and demonstrated an understanding of the instructions.  A copy of instructions were sent to the patient via MyChart unless otherwise noted below.   The patient was advised to call back or seek an in-person evaluation if the symptoms worsen or if the condition fails to improve as anticipated.    Amanda Maillard, [name]

## 2023-10-01 ENCOUNTER — Encounter: Payer: Self-pay | Admitting: *Deleted

## 2023-10-03 ENCOUNTER — Ambulatory Visit: Payer: Commercial Managed Care - PPO | Attending: Cardiology | Admitting: Cardiology

## 2023-10-29 ENCOUNTER — Telehealth

## 2024-04-15 ENCOUNTER — Encounter

## 2024-04-15 ENCOUNTER — Telehealth: Admitting: Physician Assistant

## 2024-04-15 DIAGNOSIS — R197 Diarrhea, unspecified: Secondary | ICD-10-CM | POA: Diagnosis not present

## 2024-04-15 DIAGNOSIS — J069 Acute upper respiratory infection, unspecified: Secondary | ICD-10-CM

## 2024-04-15 DIAGNOSIS — R1110 Vomiting, unspecified: Secondary | ICD-10-CM | POA: Diagnosis not present

## 2024-04-15 MED ORDER — LORATADINE 10 MG PO TABS: 10.0000 mg | ORAL_TABLET | Freq: Every day | ORAL | 0 refills | Status: AC

## 2024-04-15 MED ORDER — FLUTICASONE PROPIONATE 50 MCG/ACT NA SUSP
2.0000 | Freq: Every day | NASAL | 0 refills | Status: AC
Start: 2024-04-15 — End: ?

## 2024-04-15 MED ORDER — FAMOTIDINE 40 MG PO TABS: 40.0000 mg | ORAL_TABLET | Freq: Every day | ORAL | 0 refills | Status: AC

## 2024-04-15 MED ORDER — ONDANSETRON 4 MG PO TBDP
4.0000 mg | ORAL_TABLET | Freq: Three times a day (TID) | ORAL | 0 refills | Status: AC | PRN
Start: 2024-04-15 — End: 2024-05-05

## 2024-04-15 NOTE — Patient Instructions (Signed)
  Amanda Sparks, thank you for joining Amanda GORMAN Snuffer, [name] for today's virtual visit.  While this provider is not your primary care provider (PCP), if your PCP is located in our provider database this encounter information will be shared with them immediately following your visit.   A [name] account gives you access to today's visit and all your visits, tests, and labs performed at [name]  click here if you don't have a [name] account or go to [email or web address]  Consent: (Patient) Amanda Sparks provided verbal consent for this virtual visit at the beginning of the encounter.  Current Medications:  Current Outpatient Medications:    amoxicillin -clavulanate (AUGMENTIN ) 875-125 MG tablet, Take 1 tablet by mouth 2 (two) times daily., Disp: 14 tablet, Rfl: 0   benzonatate  (TESSALON ) 100 MG capsule, Take 1 capsule (100 mg total) by mouth 3 (three) times daily as needed for cough., Disp: 30 capsule, Rfl: 0   Cholecalciferol (VITAMIN D3) 1.25 MG (50000 UT) CAPS, Take 1 tablet by mouth once a week. X 4 weeks, Disp: , Rfl:    citalopram (CELEXA) 20 MG tablet, Take 20 mg by mouth daily., Disp: , Rfl:    fluticasone  (FLONASE ) 50 MCG/ACT nasal spray, Place 2 sprays into both nostrils daily., Disp: 16 g, Rfl: 0   phenazopyridine  (PYRIDIUM ) 200 MG tablet, Take 1 tablet (200 mg total) by mouth 3 (three) times daily., Disp: 6 tablet, Rfl: 0   traZODone (DESYREL) 150 MG tablet, Take 1 tablet by mouth at bedtime., Disp: , Rfl:    Medications ordered in this encounter:  No orders of the defined types were placed in this encounter.    *If you need refills on other medications prior to your next appointment, please contact your pharmacy*  Follow-Up: Call back or seek an in-person evaluation if the symptoms worsen or if the condition fails to improve as anticipated.  [facility] [phone]  Other Instructions Take the zofran   for nausea.   Take the claritin  and pepcid  for your rash and itching.   Take the fluticasone  for your nasal congestion   Follow up with your regular doctor in 1 week for reassessment and seek care sooner if your symptoms worsen or fail to improve.   If you have been instructed to have an in-person evaluation today at a local Urgent Care facility, please use the link below. It will take you to a list of all of our available [facility], including address, phone number and hours of operation. Please do not delay care.  [facility]  If you or a family member do not have a primary care provider, use the link below to schedule a visit and establish care. When you choose a [facility] primary care physician or advanced practice provider, you gain a long-term partner in health. Find a Primary Care Provider  Learn more about [facility]'s in-office and virtual care options: [facility] - Get Care Now

## 2024-04-15 NOTE — Progress Notes (Signed)
 Ms. debie, ashline are scheduled for a virtual visit with your provider today.    Just as we do with appointments in the office, we must obtain your consent to participate.  Your consent will be active for this visit and any virtual visit you may have with one of our providers in the next 365 days.    If you have a MyChart account, I can also send a copy of this consent to you electronically.  All virtual visits are billed to your insurance company just like a traditional visit in the office.  As this is a virtual visit, video technology does not allow for your provider to perform a traditional examination.  This may limit your provider's ability to fully assess your condition.  If your provider identifies any concerns that need to be evaluated in person or the need to arrange testing such as labs, EKG, etc, we will make arrangements to do so.    Although advances in technology are sophisticated, we cannot ensure that it will always work on either your end or our end.  If the connection with a video visit is poor, we may have to switch to a telephone visit.  With either a video or telephone visit, we are not always able to ensure that we have a secure connection.   I need to obtain your verbal consent now.   Are you willing to proceed with your visit today?   Emaya Preston has provided verbal consent on 04/15/2024 for a virtual visit (video or telephone).   [name], [name] 04/15/2024  8:13 AM   Date:  04/15/2024   ID:  Sonny Nat Para, DOB [birth year], MRN [id number]  Patient Location: Home Provider Location: Home Office   Participants: Patient and Provider for Visit and Wrap up  Method of visit: Video  Location of Patient: Home Location of Provider: Home Office Consent was obtain for visit over the video. Services rendered by provider: Visit was performed via video  A video enabled telemedicine application was used and I verified that I am speaking with the correct person  using two identifiers.  PCP:  Donal Channing SQUIBB, FNP   Chief Complaint:  NVD, rash   History of Present Illness:    Vaanya Shambaugh is a 48 y.o. female with history as stated below. Presents video telehealth for an acute care visit  Pt states 3 days ago she started having diarrhea, nausea and vomiting. These symptoms are somewhat improving but are not completely resolved. She notes that her children were also ill with diarrhea. She reports associated rhinorrhea, congestion, headache, rash, and pruritus. Denies fevers currently but did have fevers at the onset of symptoms.   Denies cough or severe abd pain. No urinary sxs reported.   Past Medical, Surgical, Social History, Allergies, and Medications have been Reviewed.  Past Medical History:  Diagnosis Date   Anemia    Family history of adverse reaction to anesthesia    1st cousin-never woke back up after surgery   GERD (gastroesophageal reflux disease)    h/o   History of palpitations     No outpatient medications have been marked as taking for the 04/15/24 encounter (Appointment) with [facility] PROVIDER.     Allergies:   Bactrim [sulfamethoxazole-trimethoprim]   ROS See HPI for history of present illness.  Physical Exam Constitutional:      Appearance: Normal appearance. She is not ill-appearing.  Pulmonary:     Effort: Pulmonary effort is normal.  Neurological:  Mental Status: She is alert.              MDM: Pt with NVD and URI sxs. Consistent with viral syndrome, children ill with similar symptoms. Will tx supportively.   Tests Ordered: No orders of the defined types were placed in this encounter.   Medication Changes: No orders of the defined types were placed in this encounter.    Disposition:  Follow up  Signed, [name], [name]  04/15/2024 8:13 AM

## 2024-05-27 ENCOUNTER — Emergency Department
Admission: EM | Admit: 2024-05-27 | Discharge: 2024-05-27 | Disposition: A | Discharge status: Home / Self Care | Attending: Emergency Medicine | Admitting: Emergency Medicine

## 2024-05-27 ENCOUNTER — Emergency Department

## 2024-05-27 DIAGNOSIS — F419 Anxiety disorder, unspecified: Secondary | ICD-10-CM | POA: Diagnosis not present

## 2024-05-27 DIAGNOSIS — R42 Dizziness and giddiness: Secondary | ICD-10-CM | POA: Diagnosis present

## 2024-05-27 DIAGNOSIS — H81399 Other peripheral vertigo, unspecified ear: Secondary | ICD-10-CM | POA: Diagnosis not present

## 2024-05-27 LAB — COMPREHENSIVE METABOLIC PANEL WITH GFR
ALT: 12 U/L (ref 0–44)
AST: 21 U/L (ref 15–41)
Albumin: 4.1 g/dL (ref 3.5–5.0)
Alkaline Phosphatase: 35 U/L — ABNORMAL LOW (ref 38–126)
Anion gap: 12 (ref 5–15)
BUN: 8 mg/dL (ref 6–20)
CO2: 19 mmol/L — ABNORMAL LOW (ref 22–32)
Calcium: 9 mg/dL (ref 8.9–10.3)
Chloride: 107 mmol/L (ref 98–111)
Creatinine, Ser: 0.85 mg/dL (ref 0.44–1.00)
GFR, Estimated: >60 mL/min (ref >60)
Glucose, Bld: 109 mg/dL — ABNORMAL HIGH (ref 70–99)
Potassium: 3.5 mmol/L (ref 3.5–5.1)
Sodium: 138 mmol/L (ref 135–145)
Total Bilirubin: 0.5 mg/dL (ref 0.0–1.2)
Total Protein: 7.6 g/dL (ref 6.5–8.1)

## 2024-05-27 LAB — CBC WITH DIFFERENTIAL/PLATELET
Abs Immature Granulocytes: 0.01 K/uL (ref 0.00–0.07)
Basophils Absolute: 0 K/uL (ref 0.0–0.1)
Basophils Relative: 1 %
Eosinophils Absolute: 0 K/uL (ref 0.0–0.5)
Eosinophils Relative: 0 %
HCT: 41.1 % (ref 36.0–46.0)
Hemoglobin: 13.3 g/dL (ref 12.0–15.0)
Immature Granulocytes: 0 %
Lymphocytes Relative: 20 %
Lymphs Abs: 0.9 K/uL (ref 0.7–4.0)
MCH: 26.9 pg (ref 26.0–34.0)
MCHC: 32.4 g/dL (ref 30.0–36.0)
MCV: 83.2 fL (ref 80.0–100.0)
Monocytes Absolute: 0.3 K/uL (ref 0.1–1.0)
Monocytes Relative: 6 %
Neutro Abs: 3.6 K/uL (ref 1.7–7.7)
Neutrophils Relative %: 73 %
Platelets: 308 K/uL (ref 150–400)
RBC: 4.94 MIL/uL (ref 3.87–5.11)
RDW: 13.1 % (ref 11.5–15.5)
WBC: 4.8 K/uL (ref 4.0–10.5)
nRBC: 0 % (ref 0.0–0.2)

## 2024-05-27 LAB — TROPONIN I (HIGH SENSITIVITY)
Troponin I (High Sensitivity): 8 ng/L (ref <18)
Troponin I (High Sensitivity): 7 ng/L (ref <18)

## 2024-05-27 LAB — MAGNESIUM: Magnesium: 1.8 mg/dL (ref 1.7–2.4)

## 2024-05-27 MED ORDER — ONDANSETRON HCL 4 MG/2ML IJ SOLN
4.0000 mg | Freq: Once | INTRAMUSCULAR | Status: AC
Start: 2024-05-27 — End: 2024-05-27
  Administered 2024-05-27: 4 mg via INTRAVENOUS
  Filled 2024-05-27: qty 2

## 2024-05-27 MED ORDER — MECLIZINE HCL 25 MG PO TABS
25.0000 mg | ORAL_TABLET | Freq: Once | ORAL | Status: AC
  Administered 2024-05-27: 25 mg via ORAL
  Filled 2024-05-27: qty 1

## 2024-05-27 MED ORDER — OXYMETAZOLINE HCL 0.05 % NA SOLN
1.0000 | Freq: Once | NASAL | Status: AC
  Administered 2024-05-27: 1 via NASAL
  Filled 2024-05-27: qty 30

## 2024-05-27 MED ORDER — MECLIZINE HCL 25 MG PO TABS: 25.0000 mg | ORAL_TABLET | Freq: Three times a day (TID) | ORAL | 0 refills | Status: AC | PRN

## 2024-05-27 MED ORDER — ONDANSETRON HCL 4 MG/2ML IJ SOLN
4.0000 mg | Freq: Once | INTRAMUSCULAR | Status: AC
  Administered 2024-05-27: 4 mg via INTRAVENOUS
  Filled 2024-05-27: qty 2

## 2024-05-27 MED ORDER — SODIUM CHLORIDE 0.9 % IV BOLUS
1000.0000 mL | Freq: Once | INTRAVENOUS | Status: AC
  Administered 2024-05-27: 1000 mL via INTRAVENOUS

## 2024-05-27 NOTE — ED Triage Notes (Signed)
 Pt presents to the ED via [facility] from home for complaints of dizziness X 1 week. Reports of having a panic attack about 0430 today. AO&4, answering questions appropriately, VSS.

## 2024-05-27 NOTE — ED Provider Notes (Addendum)
 Patient arrives with chief complaint of dizziness by EMS around time of shift change.  I did an initial medical screening examination and determined that she is not a stroke code activation given the onset of her head heaviness and dizziness has been ongoing for 1 week.  Outside of thrombolytic window.  No focal neurologic deficits at this time including facial asymmetry, dysarthria, finger-to-nose coordination, or motor or sensory deficits.  Other complaints including self-reported panic attack starting this morning included her chest tightness, heart racing, shortness of breath, which has much improved now.  She also had an episode of nausea and vomiting this morning along with her panic attack.  I have ordered for basic labs, EKG, and will defer full evaluation workup and treatment planning to the oncoming morning doctor.   Amanda Mylar, MD 05/27/24 [id number]    Amanda Mylar, MD 05/27/24 [phone]

## 2024-05-27 NOTE — ED Provider Notes (Signed)
 [facility] Provider Note    Event Date/Time   First MD Initiated Contact with Patient 05/27/24 [phone]     (approximate)   History   Dizziness  HPI  Amanda Sparks is a 48 y.o. female   history of anemia, reflux, palpitations and patient reports history of multiple panic attacks and is currently treated with Seroquel to [facility]  For about 1 week patient has reported a feeling of sinus congestion, and she reports she feels like she had a bit of a cold.  She did not sleep well last night and suffers from poor sleep chronically.  This morning she was walking and started to feel dizzy, she is noticed when she gets up quickly or turns her head that she gets a spinning type feeling.  Relates feels similar to vertigo which she had once years ago  No difficulty speaking no numbness or weakness.  She also reports that she started feeling like she had a panic attack, breathing fast, feeling tight in her chest, but although symptoms have since subsided.  She has a history of panic attacks, and reports this was definitely one of her panic attacks.  Feels like it was triggered by the dizziness and poor sleep for which she reports she actually did not sleep at all last night  Now she feels much better she is fine at rest.  However if she turns her head or gets up she will start to feel the sort of dizzy feeling      Physical Exam   Triage Vital Signs: ED Triage Vitals  Encounter Vitals Group     BP 05/27/24 0628 127/73     Girls Systolic BP Percentile --      Girls Diastolic BP Percentile --      Boys Systolic BP Percentile --      Boys Diastolic BP Percentile --      Pulse Rate 05/27/24 0628 87     Resp 05/27/24 0628 18     Temp 05/27/24 0628 98.7 F (37.1 C)     Temp Source 05/27/24 0628 Oral     SpO2 05/27/24 0628 100 %     Weight 05/27/24 0637 160 lb (72.6 kg)     Height 05/27/24 0637 5' 4 (1.626 m)     Head Circumference --      Peak Flow --       Pain Score 05/27/24 0636 0     Pain Loc --      Pain Education --      Exclude from Growth Chart --     Most recent vital signs: Vitals:   05/27/24 1100 05/27/24 1217  BP: (!) 135/90 133/89  Pulse: 72 74  Resp: (!) 22 18  Temp:  98.6 F (37 C)  SpO2: 100% 100%     General: Awake, no distress.  Normocephalic atraumatic. Patient reports when she turns her head quickly side-to-side makes her feel short of like a spinning or rushing type feeling. CV:  Good peripheral perfusion.  Normal tones and rate Resp:  Normal effort.  Abd:  No distention.  Other:  Moves all extremities well.  Fully alert and oriented.  Tympanic membranes normal bilateral   ED Results / Procedures / Treatments   Labs (all labs ordered are listed, but only abnormal results are displayed) Labs Reviewed  COMPREHENSIVE METABOLIC PANEL WITH GFR - Abnormal; Notable for the following components:      Result Value   CO2  19 (*)    Glucose, Bld 109 (*)    Alkaline Phosphatase 35 (*)    All other components within normal limits  CBC WITH DIFFERENTIAL/PLATELET  MAGNESIUM  TROPONIN I (HIGH SENSITIVITY)  TROPONIN I (HIGH SENSITIVITY)     EKG  Interpreted by me at 640 heart rate 80 QRS 90 QTc 420 Normal sinus rhythm no evidence ischemia   RADIOLOGY  CT Head Wo Contrast Result Date: 05/27/2024 EXAM: CT HEAD WITHOUT CONTRAST 05/27/2024 07:38:43 AM TECHNIQUE: CT of the head was performed without the administration of intravenous contrast. Automated exposure control, iterative reconstruction, and/or weight based adjustment of the mA/kV was utilized to reduce the radiation dose to as low as reasonably achievable. COMPARISON: Head CT and brain MRI dated 12/02/2022. CLINICAL HISTORY: 48 year old female. Vertigo, peripheral. Dizziness. FINDINGS: BRAIN AND VENTRICLES: Normal for age non-contrast head CT. Cerebral volume is stable and normal. No acute hemorrhage. No evidence of acute infarct. No hydrocephalus. No  extra-axial collection. No mass effect or midline shift. . ORBITS: No acute abnormality. SINUSES: No acute abnormality. SOFT TISSUES AND SKULL: No acute soft tissue abnormality. No skull fracture. IMPRESSION: 1. Normal for age non-contrast head CT. Electronically signed by: Helayne Hurst MD 05/27/2024 07:49 AM EDT RP Workstation: [id number]   DG Chest Port 1 View Result Date: 05/27/2024 CLINICAL DATA:  Chest pain, dizziness, and nausea. EXAM: PORTABLE CHEST 1 VIEW COMPARISON:  07/24/2023 FINDINGS: The heart size and mediastinal contours are within normal limits. Both lungs are clear. The visualized skeletal structures are unremarkable. IMPRESSION: No active disease. Electronically Signed   By: Norleen DELENA Kil M.D.   On: 05/27/2024 07:24      PROCEDURES:  Critical Care performed: No  Procedures   MEDICATIONS ORDERED IN ED: Medications  ondansetron  (ZOFRAN ) injection 4 mg (4 mg Intravenous Given 05/27/24 9347)  sodium chloride  0.9 % bolus 1,000 mL (0 mLs Intravenous Stopped 05/27/24 0826)  meclizine (ANTIVERT) tablet 25 mg (25 mg Oral Given 05/27/24 0744)  ondansetron  (ZOFRAN ) injection 4 mg (4 mg Intravenous Given 05/27/24 0744)  oxymetazoline (AFRIN) 0.05 % nasal spray 1 spray (1 spray Each Nare Given 05/27/24 0744)     IMPRESSION / MDM / ASSESSMENT AND PLAN / ED COURSE  I reviewed the triage vital signs and the nursing notes.                              Differential diagnosis includes, but is not limited to, suspect possible vertigo, mild sinus congestion, seems to be element of what appears to be peripheral vertigo.  No central neurologic features.  CT imaging of the head very reassuring.  Cardiac workup negative, no associated chest pain.  Patient reports history of panic attacks with similar presentation as well.  After receiving Afrin, meclizine symptoms feel much better.  Resting comfortably without distress.  Patient denies any concern or risk for pregnancy  Reassuring ECG cardiac  workup chest imaging  Patient's presentation is most consistent with acute complicated illness / injury requiring diagnostic workup.   The patient is on the cardiac monitor to evaluate for evidence of arrhythmia and/or significant heart rate changes.   Clinical Course as of 05/27/24 1553  Wed May 27, 2024  0807 Normal CBC troponin and head CT [MQ]  0807 Patient resting comfortably without distress.  At this juncture, I suspect she may have had episode of vertigo especially in relation to having had some sinus discomfort and pressure over  the last week.  She is awake alert well-oriented.  No focal deficits.  She has a history of panic attacks of similar [MQ]  0808  symptomatology in I do not have anything to suggest an acute cardiac concern at this time. [MQ]    Clinical Course User Index [MQ] Dicky Anes, MD   Return precautions and treatment recommendations and follow-up discussed with the patient who is agreeable with the plan.  Did discuss with patient recommended she not drive for at least a couple of days until she feels vertigo type symptoms have resolved completely and not while using meclizine.  She is agreeable with this plan.  Follow-up with PCP advised, also discussed with her that she may wish to set up a follow-up with an ear nose and throat clinic if symptoms of vertigo persist for further evaluation.  FINAL CLINICAL IMPRESSION(S) / ED DIAGNOSES   Final diagnoses:  Peripheral vertigo, unspecified laterality  Anxiety     Rx / DC Orders   ED Discharge Orders          Ordered    meclizine (ANTIVERT) 25 MG tablet  3 times daily PRN        05/27/24 1126             Note:  This document was prepared using Dragon voice recognition software and may include unintentional dictation errors.   Dicky Anes, MD 05/27/24 [phone]

## 2024-05-27 NOTE — ED Notes (Signed)
 Pt to CT
